# Patient Record
Sex: Male | Born: 1970 | Race: White | Hispanic: No | Marital: Single | Smoking: Never smoker
Health system: Southern US, Community
[De-identification: ages and names within clinical notes are randomized; demographics above are authoritative.]

## PROBLEM LIST (undated history)

## (undated) DIAGNOSIS — E119 Type 2 diabetes mellitus without complications: Secondary | ICD-10-CM

## (undated) DIAGNOSIS — K760 Fatty (change of) liver, not elsewhere classified: Secondary | ICD-10-CM

## (undated) DIAGNOSIS — E785 Hyperlipidemia, unspecified: Secondary | ICD-10-CM

## (undated) DIAGNOSIS — I10 Essential (primary) hypertension: Secondary | ICD-10-CM

## (undated) HISTORY — DX: Type 2 diabetes mellitus without complications: E11.9

## (undated) HISTORY — PX: APPENDECTOMY: SHX54

## (undated) HISTORY — DX: Fatty (change of) liver, not elsewhere classified: K76.0

## (undated) HISTORY — PX: TUMOR REMOVAL: SHX12

## (undated) HISTORY — DX: Essential (primary) hypertension: I10

## (undated) HISTORY — DX: Hyperlipidemia, unspecified: E78.5

---

## 1998-07-26 ENCOUNTER — Emergency Department (HOSPITAL_COMMUNITY): Admission: EM | Admit: 1998-07-26 | Discharge: 1998-07-26 | Payer: Self-pay | Admitting: Emergency Medicine

## 1998-09-24 ENCOUNTER — Emergency Department (HOSPITAL_COMMUNITY): Admission: EM | Admit: 1998-09-24 | Discharge: 1998-09-24 | Payer: Self-pay | Admitting: Emergency Medicine

## 1998-09-24 ENCOUNTER — Encounter: Payer: Self-pay | Admitting: Emergency Medicine

## 1998-10-12 ENCOUNTER — Encounter: Admission: RE | Admit: 1998-10-12 | Discharge: 1998-10-12 | Payer: Self-pay | Admitting: *Deleted

## 1998-12-17 ENCOUNTER — Emergency Department (HOSPITAL_COMMUNITY): Admission: EM | Admit: 1998-12-17 | Discharge: 1998-12-17 | Payer: Self-pay | Admitting: *Deleted

## 1999-02-22 ENCOUNTER — Encounter: Payer: Self-pay | Admitting: Emergency Medicine

## 1999-02-22 ENCOUNTER — Emergency Department (HOSPITAL_COMMUNITY): Admission: EM | Admit: 1999-02-22 | Discharge: 1999-02-22 | Payer: Self-pay | Admitting: Emergency Medicine

## 1999-05-20 ENCOUNTER — Emergency Department (HOSPITAL_COMMUNITY): Admission: EM | Admit: 1999-05-20 | Discharge: 1999-05-20 | Payer: Self-pay | Admitting: Emergency Medicine

## 1999-08-21 ENCOUNTER — Emergency Department (HOSPITAL_COMMUNITY): Admission: EM | Admit: 1999-08-21 | Discharge: 1999-08-21 | Payer: Self-pay | Admitting: Internal Medicine

## 1999-10-25 ENCOUNTER — Emergency Department (HOSPITAL_COMMUNITY): Admission: EM | Admit: 1999-10-25 | Discharge: 1999-10-25 | Payer: Self-pay | Admitting: Emergency Medicine

## 2000-05-13 ENCOUNTER — Emergency Department (HOSPITAL_COMMUNITY): Admission: EM | Admit: 2000-05-13 | Discharge: 2000-05-13 | Payer: Self-pay | Admitting: Emergency Medicine

## 2000-05-13 ENCOUNTER — Encounter: Payer: Self-pay | Admitting: Emergency Medicine

## 2000-05-23 ENCOUNTER — Emergency Department (HOSPITAL_COMMUNITY): Admission: EM | Admit: 2000-05-23 | Discharge: 2000-05-23 | Payer: Self-pay | Admitting: Emergency Medicine

## 2000-10-25 ENCOUNTER — Emergency Department (HOSPITAL_COMMUNITY): Admission: EM | Admit: 2000-10-25 | Discharge: 2000-10-25 | Payer: Self-pay | Admitting: Internal Medicine

## 2000-10-25 ENCOUNTER — Encounter: Payer: Self-pay | Admitting: Emergency Medicine

## 2001-06-30 ENCOUNTER — Emergency Department (HOSPITAL_COMMUNITY): Admission: EM | Admit: 2001-06-30 | Discharge: 2001-06-30 | Payer: Self-pay | Admitting: Emergency Medicine

## 2001-11-26 ENCOUNTER — Emergency Department (HOSPITAL_COMMUNITY): Admission: EM | Admit: 2001-11-26 | Discharge: 2001-11-26 | Payer: Self-pay | Admitting: Emergency Medicine

## 2001-12-03 ENCOUNTER — Emergency Department (HOSPITAL_COMMUNITY): Admission: EM | Admit: 2001-12-03 | Discharge: 2001-12-03 | Payer: Self-pay | Admitting: *Deleted

## 2002-01-13 ENCOUNTER — Emergency Department (HOSPITAL_COMMUNITY): Admission: EM | Admit: 2002-01-13 | Discharge: 2002-01-13 | Payer: Self-pay | Admitting: *Deleted

## 2002-07-08 ENCOUNTER — Emergency Department (HOSPITAL_COMMUNITY): Admission: EM | Admit: 2002-07-08 | Discharge: 2002-07-08 | Payer: Self-pay | Admitting: Emergency Medicine

## 2002-10-31 ENCOUNTER — Emergency Department (HOSPITAL_COMMUNITY): Admission: EM | Admit: 2002-10-31 | Discharge: 2002-11-01 | Payer: Self-pay | Admitting: Emergency Medicine

## 2002-12-04 ENCOUNTER — Emergency Department (HOSPITAL_COMMUNITY): Admission: EM | Admit: 2002-12-04 | Discharge: 2002-12-04 | Payer: Self-pay | Admitting: Emergency Medicine

## 2002-12-24 ENCOUNTER — Emergency Department (HOSPITAL_COMMUNITY): Admission: EM | Admit: 2002-12-24 | Discharge: 2002-12-25 | Payer: Self-pay | Admitting: Emergency Medicine

## 2003-10-29 ENCOUNTER — Emergency Department (HOSPITAL_COMMUNITY): Admission: EM | Admit: 2003-10-29 | Discharge: 2003-10-29 | Payer: Self-pay | Admitting: Emergency Medicine

## 2004-03-30 ENCOUNTER — Emergency Department (HOSPITAL_COMMUNITY): Admission: EM | Admit: 2004-03-30 | Discharge: 2004-03-30 | Payer: Self-pay | Admitting: Emergency Medicine

## 2004-06-10 ENCOUNTER — Emergency Department (HOSPITAL_COMMUNITY): Admission: EM | Admit: 2004-06-10 | Discharge: 2004-06-10 | Payer: Self-pay | Admitting: Emergency Medicine

## 2004-07-20 ENCOUNTER — Emergency Department (HOSPITAL_COMMUNITY): Admission: EM | Admit: 2004-07-20 | Discharge: 2004-07-20 | Payer: Self-pay | Admitting: Emergency Medicine

## 2004-07-24 ENCOUNTER — Emergency Department (HOSPITAL_COMMUNITY): Admission: EM | Admit: 2004-07-24 | Discharge: 2004-07-24 | Payer: Self-pay | Admitting: Emergency Medicine

## 2005-03-22 ENCOUNTER — Emergency Department (HOSPITAL_COMMUNITY): Admission: EM | Admit: 2005-03-22 | Discharge: 2005-03-22 | Payer: Self-pay | Admitting: Family Medicine

## 2005-03-24 ENCOUNTER — Emergency Department (HOSPITAL_COMMUNITY): Admission: EM | Admit: 2005-03-24 | Discharge: 2005-03-24 | Payer: Self-pay | Admitting: Family Medicine

## 2005-08-13 ENCOUNTER — Emergency Department (HOSPITAL_COMMUNITY): Admission: EM | Admit: 2005-08-13 | Discharge: 2005-08-13 | Payer: Self-pay | Admitting: Emergency Medicine

## 2005-11-17 ENCOUNTER — Emergency Department (HOSPITAL_COMMUNITY): Admission: EM | Admit: 2005-11-17 | Discharge: 2005-11-17 | Payer: Self-pay | Admitting: Emergency Medicine

## 2006-03-30 ENCOUNTER — Emergency Department (HOSPITAL_COMMUNITY): Admission: EM | Admit: 2006-03-30 | Discharge: 2006-03-31 | Payer: Self-pay | Admitting: Emergency Medicine

## 2006-04-11 ENCOUNTER — Emergency Department (HOSPITAL_COMMUNITY): Admission: EM | Admit: 2006-04-11 | Discharge: 2006-04-11 | Payer: Self-pay | Admitting: Emergency Medicine

## 2009-06-25 ENCOUNTER — Emergency Department (HOSPITAL_COMMUNITY): Admission: EM | Admit: 2009-06-25 | Discharge: 2009-06-25 | Payer: Self-pay | Admitting: Emergency Medicine

## 2009-10-15 ENCOUNTER — Emergency Department (HOSPITAL_COMMUNITY): Admission: EM | Admit: 2009-10-15 | Discharge: 2009-10-15 | Payer: Self-pay | Admitting: Emergency Medicine

## 2009-11-23 ENCOUNTER — Emergency Department (HOSPITAL_COMMUNITY): Admission: EM | Admit: 2009-11-23 | Discharge: 2009-11-24 | Payer: Self-pay | Admitting: Emergency Medicine

## 2010-07-05 ENCOUNTER — Emergency Department (HOSPITAL_COMMUNITY)
Admission: EM | Admit: 2010-07-05 | Discharge: 2010-07-06 | Disposition: A | Discharge status: Home / Self Care | Payer: Self-pay | Attending: Emergency Medicine | Admitting: Emergency Medicine

## 2010-07-05 DIAGNOSIS — Z09 Encounter for follow-up examination after completed treatment for conditions other than malignant neoplasm: Secondary | ICD-10-CM | POA: Insufficient documentation

## 2010-07-05 DIAGNOSIS — S81009A Unspecified open wound, unspecified knee, initial encounter: Secondary | ICD-10-CM | POA: Insufficient documentation

## 2010-07-05 DIAGNOSIS — X58XXXA Exposure to other specified factors, initial encounter: Secondary | ICD-10-CM | POA: Insufficient documentation

## 2010-08-02 LAB — POCT I-STAT, CHEM 8
BUN: 17 mg/dL (ref 6–23)
Calcium, Ion: 1.14 mmol/L (ref 1.12–1.32)
Chloride: 106 mEq/L (ref 96–112)
Glucose, Bld: 112 mg/dL — ABNORMAL HIGH (ref 70–99)
Hemoglobin: 15.6 g/dL (ref 13.0–17.0)
Potassium: 3.8 mEq/L (ref 3.5–5.1)
TCO2: 28 mmol/L (ref 0–100)

## 2010-08-02 LAB — D-DIMER, QUANTITATIVE: D-Dimer, Quant: 0.25 ug/mL-FEU (ref 0.00–0.48)

## 2010-08-06 LAB — URINALYSIS, ROUTINE W REFLEX MICROSCOPIC
Glucose, UA: NEGATIVE mg/dL
Nitrite: NEGATIVE
Protein, ur: NEGATIVE mg/dL
Urobilinogen, UA: 0.2 mg/dL (ref 0.0–1.0)

## 2010-08-06 LAB — GC/CHLAMYDIA PROBE AMP, GENITAL: GC Probe Amp, Genital: NEGATIVE

## 2010-08-10 ENCOUNTER — Emergency Department (HOSPITAL_COMMUNITY)
Admission: EM | Admit: 2010-08-10 | Discharge: 2010-08-10 | Disposition: A | Discharge status: Home / Self Care | Payer: Self-pay | Attending: Emergency Medicine | Admitting: Emergency Medicine

## 2010-08-10 DIAGNOSIS — M79609 Pain in unspecified limb: Secondary | ICD-10-CM | POA: Insufficient documentation

## 2010-08-10 DIAGNOSIS — R509 Fever, unspecified: Secondary | ICD-10-CM | POA: Insufficient documentation

## 2010-08-10 DIAGNOSIS — R21 Rash and other nonspecific skin eruption: Secondary | ICD-10-CM | POA: Insufficient documentation

## 2010-08-10 DIAGNOSIS — IMO0002 Reserved for concepts with insufficient information to code with codable children: Secondary | ICD-10-CM | POA: Insufficient documentation

## 2010-08-13 ENCOUNTER — Observation Stay (HOSPITAL_COMMUNITY)
Admission: EM | Admit: 2010-08-13 | Discharge: 2010-08-13 | Disposition: A | Discharge status: Home / Self Care | Payer: Self-pay | Attending: Emergency Medicine | Admitting: Emergency Medicine

## 2010-08-13 DIAGNOSIS — IMO0002 Reserved for concepts with insufficient information to code with codable children: Principal | ICD-10-CM | POA: Insufficient documentation

## 2010-08-13 LAB — CBC
Hemoglobin: 13.9 g/dL (ref 13.0–17.0)
MCHC: 33.7 g/dL (ref 30.0–36.0)
MCV: 85.3 fL (ref 78.0–100.0)
RDW: 13.7 % (ref 11.5–15.5)

## 2010-08-13 LAB — DIFFERENTIAL
Basophils Absolute: 0 10*3/uL (ref 0.0–0.1)
Basophils Relative: 0 % (ref 0–1)
Lymphocytes Relative: 21 % (ref 12–46)
Monocytes Absolute: 0.8 10*3/uL (ref 0.1–1.0)
Neutro Abs: 6.7 10*3/uL (ref 1.7–7.7)
Neutrophils Relative %: 68 % (ref 43–77)

## 2010-08-13 LAB — BASIC METABOLIC PANEL
CO2: 26 mEq/L (ref 19–32)
GFR calc Af Amer: >60 mL/min (ref >60)
GFR calc non Af Amer: 60 mL/min — ABNORMAL LOW (ref >60)
Potassium: 4 mEq/L (ref 3.5–5.1)
Sodium: 139 mEq/L (ref 135–145)

## 2010-08-15 ENCOUNTER — Emergency Department (HOSPITAL_COMMUNITY)
Admission: EM | Admit: 2010-08-15 | Discharge: 2010-08-15 | Disposition: A | Discharge status: Home / Self Care | Payer: Self-pay | Attending: Emergency Medicine | Admitting: Emergency Medicine

## 2010-08-15 DIAGNOSIS — IMO0002 Reserved for concepts with insufficient information to code with codable children: Secondary | ICD-10-CM | POA: Insufficient documentation

## 2010-08-15 DIAGNOSIS — Z09 Encounter for follow-up examination after completed treatment for conditions other than malignant neoplasm: Secondary | ICD-10-CM | POA: Insufficient documentation

## 2010-08-15 DIAGNOSIS — L2989 Other pruritus: Secondary | ICD-10-CM | POA: Insufficient documentation

## 2010-08-15 DIAGNOSIS — Z4800 Encounter for change or removal of nonsurgical wound dressing: Secondary | ICD-10-CM | POA: Insufficient documentation

## 2010-08-15 DIAGNOSIS — L298 Other pruritus: Secondary | ICD-10-CM | POA: Insufficient documentation

## 2010-08-16 LAB — CULTURE, ROUTINE-ABSCESS

## 2011-07-19 ENCOUNTER — Ambulatory Visit (INDEPENDENT_AMBULATORY_CARE_PROVIDER_SITE_OTHER): Payer: Self-pay | Admitting: Family Medicine

## 2011-07-19 ENCOUNTER — Encounter: Payer: Self-pay | Admitting: Family Medicine

## 2011-07-19 VITALS — BP 132/84 | HR 85 | Temp 97.9°F | Ht 67.0 in | Wt 223.0 lb

## 2011-07-19 DIAGNOSIS — M542 Cervicalgia: Secondary | ICD-10-CM

## 2011-07-19 NOTE — Patient Instructions (Signed)
You have cervical radiculopathy (a pinched nerve in the neck). Prednisone x 2 weeks as directed followed by meloxicam 15mg  daily for pain and to relieve irritation/inflammation of the nerve. Flexeril three times a day as needed for muscle spasms (can make you sleepy - if so do not drive while taking this). Vicodin for severe pain (no driving on this medicine). Call me when the [facility] (cone coverage) coverage goes through and we will move forward with an MRI of your neck if pain is still severe and start physical therapy. Consider cervical collar if severely painful. Simple range of motion exercises within limits of pain to prevent further stiffness. Consider physical therapy for stretching, exercises, traction, and modalities. Heat 15 minutes at a time 3-4 times a day to help with spasms. Watch head position when on computers, texting, when sleeping in bed - should in line with back to prevent further nerve traction and irritation.

## 2011-07-21 ENCOUNTER — Encounter: Payer: Self-pay | Admitting: Family Medicine

## 2011-07-21 DIAGNOSIS — M5412 Radiculopathy, cervical region: Secondary | ICD-10-CM | POA: Insufficient documentation

## 2011-07-21 NOTE — Progress Notes (Addendum)
Subjective:    Patient ID: Joe Pearson, male    DOB: Jun 11, 1970, 41 y.o.   MRN: [id number]  PCP: None  HPI 41 yo M here for neck, right arm pain.  Patient denies known injury. States about 1 week ago he woke up with fairly severe right sided neck pain radiating into shoulder and down arm. Associated with numbness and tingling. No prior neck problems. No bowel/bladder dysfunction. Taking vicodin as needed for pain. Tried prednisone dose pack which helped some but pain has worsened since stopping this.  History reviewed. No pertinent past medical history.  No current outpatient prescriptions on file prior to visit.    History reviewed. No pertinent past surgical history.  No Known Allergies  History   Social History  . Marital Status: Single    Spouse Name: N/A    Number of Children: N/A  . Years of Education: N/A   Occupational History  . Not on file.   Social History Main Topics  . Smoking status: Never Smoker   . Smokeless tobacco: Not on file  . Alcohol Use: Not on file  . Drug Use: Not on file  . Sexually Active: Not on file   Other Topics Concern  . Not on file   Social History Narrative  . No narrative on file    Family History  Problem Relation Age of Onset  . Sudden death Neg Hx   . Hyperlipidemia Neg Hx   . Hypertension Neg Hx   . Heart attack Neg Hx   . Diabetes Neg Hx     BP 132/84  Pulse 85  Temp(Src) 97.9 F (36.6 C) (Oral)  Ht 5\' 7"  (1.702 m)  Wt 223 lb (101.152 kg)  BMI 34.93 kg/m2  Review of Systems See HPI above.    Objective:   Physical Exam Gen: NAD  Neck: No gross deformity, swelling, bruising.  Spasms right paraspinal muscles. TTP right paraspinal muscles.  No midline/bony TTP. Minimal extension with pain.  Full flexion with mild pain, pain with turning to left side as well. BUE strength 5/5. Sensation intact to light touch currently. 2+ equal reflexes in triceps, biceps, brachioradialis tendons. Negative  spurlings. NV intact distal BUEs.  R shoulder: No swelling, ecchymoses.  No gross deformity. No TTP. FROM without painful arc. Negative Hawkins, Neers. Negative Speeds, Yergasons. Strength 5/5 with empty can and resisted internal/external rotation, no pain with these. Negative apprehension. NV intact distally.    Assessment & Plan:  1. Neck pain - 2/2 cervical radiculopathy.  Start longer course of prednisone, transition to meloxicam.  Flexeril and vicodin (#60, q6h prn) as needed.  Does not have insurance so provided with info to contact [facility] and apply for [employer] coverage.  If not improving will proceed with x-rays then MRI of cervical spine, consider PT.  Has a cervical collar already - use this as needed.  Call us when [employer] coverage goes through to proceed with imaging, start PT.  Addendum 3/12:  Patient called stating pain still about the same but [employer] Coverage has gone through - will move forward with MRI of cervical spine and call patient with results and how to proceed.  Addendum 3/20:  Reviewed MRI results and discussed with patient.  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  He reports his symptoms have significantly improved, denies bowel/bladder dysfunction, lower extremity symptoms, upper extremity weakness.  Advised we should continue to  monitor him - do not think ESIs would provide him with benefit should symptoms recur - would move forward with neurosurgical referral if medications didn't help with another flare.

## 2011-07-21 NOTE — Assessment & Plan Note (Signed)
2/2 cervical radiculopathy.  Start longer course of prednisone, transition to meloxicam.  Flexeril and vicodin (#60, q6h prn) as needed.  Does not have insurance so provided with info to contact [facility] and apply for [employer] coverage.  If not improving will proceed with x-rays then MRI of cervical spine, consider PT.  Has a cervical collar already - use this as needed.  Call us when [employer] coverage goes through to proceed with imaging, start PT.

## 2011-07-26 ENCOUNTER — Other Ambulatory Visit: Payer: Self-pay | Admitting: *Deleted

## 2011-07-26 MED ORDER — DIAZEPAM 5 MG PO TABS: ORAL_TABLET | ORAL | Status: DC

## 2011-07-26 NOTE — Progress Notes (Signed)
Addended by: Lenda Kelp on: 07/26/2011 08:41 AM   Modules accepted: Orders

## 2011-08-02 ENCOUNTER — Ambulatory Visit (HOSPITAL_BASED_OUTPATIENT_CLINIC_OR_DEPARTMENT_OTHER)
Admission: RE | Admit: 2011-08-02 | Discharge: 2011-08-02 | Disposition: A | Discharge status: Home / Self Care | Payer: Self-pay | Source: Ambulatory Visit | Attending: Family Medicine | Admitting: Family Medicine

## 2011-08-02 DIAGNOSIS — M542 Cervicalgia: Secondary | ICD-10-CM | POA: Insufficient documentation

## 2011-08-02 DIAGNOSIS — R209 Unspecified disturbances of skin sensation: Secondary | ICD-10-CM | POA: Insufficient documentation

## 2011-08-02 DIAGNOSIS — M5020 Other cervical disc displacement, unspecified cervical region: Secondary | ICD-10-CM | POA: Insufficient documentation

## 2011-08-02 DIAGNOSIS — M4802 Spinal stenosis, cervical region: Secondary | ICD-10-CM

## 2011-08-02 DIAGNOSIS — M79609 Pain in unspecified limb: Secondary | ICD-10-CM

## 2011-08-23 ENCOUNTER — Ambulatory Visit (INDEPENDENT_AMBULATORY_CARE_PROVIDER_SITE_OTHER): Payer: Self-pay | Admitting: Family Medicine

## 2011-08-23 ENCOUNTER — Other Ambulatory Visit: Payer: Self-pay | Admitting: Family Medicine

## 2011-08-23 ENCOUNTER — Encounter: Payer: Self-pay | Admitting: Family Medicine

## 2011-08-23 VITALS — BP 122/86 | HR 84 | Temp 98.0°F | Ht 68.0 in | Wt 212.0 lb

## 2011-08-23 DIAGNOSIS — M542 Cervicalgia: Secondary | ICD-10-CM

## 2011-08-23 NOTE — Patient Instructions (Signed)
You have cervical radiculopathy (a pinched nerve in the neck). I don't think it's a good idea to burst you with prednisone pills a third time. Vicodin for severe pain (no driving on this medicine). We will refer you to [facility] imaging to try cortisone shots in your neck to help with the pain. Try simple range of motion exercises within limits of pain to prevent further stiffness. Consider physical therapy for stretching, exercises, traction, and modalities. Heat 15 minutes at a time 3-4 times a day to help with spasms. Watch head position when on computers, texting, when sleeping in bed - should in line with back to prevent further nerve traction and irritation. We will also try to get you in with a neurosurgeon in case the shots don't help you.

## 2011-08-23 NOTE — Progress Notes (Signed)
  Subjective:    Patient ID: Joe Pearson, male    DOB: 1970/05/18, 41 y.o.   MRN: [id number]  PCP: None  HPI  41 yo M here for f/u neck, right arm pain.  3/5: Patient denies known injury. States about 1 week ago he woke up with fairly severe right sided neck pain radiating into shoulder and down arm. Associated with numbness and tingling. No prior neck problems. No bowel/bladder dysfunction. Taking vicodin as needed for pain. Tried prednisone dose pack which helped some but pain has worsened since stopping this.  4/9: Patient reports his pain, numbness, tingling completely resolved then has recurred at 10/10 pain 3 days ago. No new injury or increase in activity level. Out of medications now - he did report he never finished prednisone I prescribed him because he felt better. No bowel/bladder dysfunction. Radiates into right hand with numbness, tingling, and weakness.  History reviewed. No pertinent past medical history.  No current outpatient prescriptions on file prior to visit.    History reviewed. No pertinent past surgical history.  No Known Allergies  History   Social History  . Marital Status: Single    Spouse Name: N/A    Number of Children: N/A  . Years of Education: N/A   Occupational History  . Not on file.   Social History Main Topics  . Smoking status: Never Smoker   . Smokeless tobacco: Not on file  . Alcohol Use: Not on file  . Drug Use: Not on file  . Sexually Active: Not on file   Other Topics Concern  . Not on file   Social History Narrative  . No narrative on file    Family History  Problem Relation Age of Onset  . Sudden death Neg Hx   . Hyperlipidemia Neg Hx   . Hypertension Neg Hx   . Heart attack Neg Hx   . Diabetes Neg Hx     BP 122/86  Pulse 84  Temp(Src) 98 F (36.7 C) (Oral)  Ht 5\' 8"  (1.727 m)  Wt 212 lb (96.163 kg)  BMI 32.23 kg/m2  Review of Systems  See HPI above.    Objective:   Physical Exam  Gen:  NAD  Neck: No gross deformity, swelling, bruising.  Spasms right paraspinal muscles. TTP right paraspinal muscles.  No midline/bony TTP. Minimal extension with pain.  Full flexion with mild pain, Full left lat rotation, only 20 degrees right lat rotation with pain. 4/5 strength with right elbow flexion, wrist extension, finger abduction and thumb opposition.  5/5 strength left upper extremity and right elbow extension. Sensation intact to light touch currently. 2+ equal reflexes in triceps, biceps, brachioradialis tendons. Negative spurlings. NV intact distal BUEs.     Assessment & Plan:  1. Neck pain - 2/2 cervical radiculopathy.  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  Symptoms could be from either level as he has fairly diffuse right upper extremity symptoms.  Advised to continue with prior prednisone that he had not finished (only took 2 days worth).  Rx vicodin (#60, q6h prn) as needed.  Refer to [facility] imaging to trial ESIs.  Will also refer to neurosurgery - keep appointment if he does not improve with injections.  Given severity of MRI findings think it is more likely ESIs won't give him long-lasting relief from his symptoms.

## 2011-08-23 NOTE — Assessment & Plan Note (Signed)
2/2 cervical radiculopathy.  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  Symptoms could be from either level as he has fairly diffuse right upper extremity symptoms.  Advised to continue with prior prednisone that he had not finished (only took 2 days worth).  Rx vicodin (#60, q6h prn) as needed.  Refer to [facility] imaging to trial ESIs.  Will also refer to neurosurgery - keep appointment if he does not improve with injections.  Given severity of MRI findings think it is more likely ESIs won't give him long-lasting relief from his symptoms.

## 2011-08-24 ENCOUNTER — Ambulatory Visit
Admission: RE | Admit: 2011-08-24 | Discharge: 2011-08-24 | Disposition: A | Discharge status: Home / Self Care | Payer: No Typology Code available for payment source | Source: Ambulatory Visit | Attending: Family Medicine | Admitting: Family Medicine

## 2011-08-24 DIAGNOSIS — M542 Cervicalgia: Secondary | ICD-10-CM

## 2011-08-24 MED ORDER — IOHEXOL 300 MG/ML  SOLN
1.0000 mL | Freq: Once | INTRAMUSCULAR | Status: AC | PRN
  Administered 2011-08-24: 1 mL via EPIDURAL

## 2011-08-24 MED ORDER — TRIAMCINOLONE ACETONIDE 40 MG/ML IJ SUSP (RADIOLOGY)
60.0000 mg | Freq: Once | INTRAMUSCULAR | Status: AC
  Administered 2011-08-24: 60 mg via EPIDURAL

## 2011-08-24 NOTE — Discharge Instructions (Signed)

## 2012-11-06 ENCOUNTER — Encounter (HOSPITAL_COMMUNITY): Payer: Self-pay | Admitting: Emergency Medicine

## 2012-11-06 ENCOUNTER — Emergency Department (HOSPITAL_COMMUNITY)
Admission: EM | Admit: 2012-11-06 | Discharge: 2012-11-06 | Disposition: A | Discharge status: Home / Self Care | Payer: BC Managed Care – PPO | Attending: Emergency Medicine | Admitting: Emergency Medicine

## 2012-11-06 ENCOUNTER — Emergency Department (HOSPITAL_COMMUNITY): Payer: BC Managed Care – PPO

## 2012-11-06 DIAGNOSIS — R0789 Other chest pain: Secondary | ICD-10-CM | POA: Insufficient documentation

## 2012-11-06 DIAGNOSIS — R0602 Shortness of breath: Secondary | ICD-10-CM | POA: Insufficient documentation

## 2012-11-06 LAB — CBC WITH DIFFERENTIAL/PLATELET
Eosinophils Relative: 3 % (ref 0–5)
Hemoglobin: 13.6 g/dL (ref 13.0–17.0)
MCHC: 32.5 g/dL (ref 30.0–36.0)
Monocytes Absolute: 0.6 10*3/uL (ref 0.1–1.0)
Monocytes Relative: 9 % (ref 3–12)
Neutro Abs: 3.6 10*3/uL (ref 1.7–7.7)
Neutrophils Relative %: 52 % (ref 43–77)
RBC: 4.85 MIL/uL (ref 4.22–5.81)
RDW: 13.6 % (ref 11.5–15.5)
WBC: 7 10*3/uL (ref 4.0–10.5)

## 2012-11-06 LAB — POCT I-STAT TROPONIN I: Troponin i, poc: 0 ng/mL (ref 0.00–0.08)

## 2012-11-06 LAB — COMPREHENSIVE METABOLIC PANEL
Albumin: 3.8 g/dL (ref 3.5–5.2)
Alkaline Phosphatase: 55 U/L (ref 39–117)
Creatinine, Ser: 1.2 mg/dL (ref 0.50–1.35)
Potassium: 3.4 mEq/L — ABNORMAL LOW (ref 3.5–5.1)
Total Bilirubin: 0.3 mg/dL (ref 0.3–1.2)
Total Protein: 6.7 g/dL (ref 6.0–8.3)

## 2012-11-06 LAB — URINALYSIS, ROUTINE W REFLEX MICROSCOPIC
Bilirubin Urine: NEGATIVE
Glucose, UA: NEGATIVE mg/dL
Leukocytes, UA: NEGATIVE
Nitrite: NEGATIVE
Specific Gravity, Urine: 1.024 (ref 1.005–1.030)
pH: 6.5 (ref 5.0–8.0)

## 2012-11-06 LAB — RAPID URINE DRUG SCREEN, HOSP PERFORMED
Amphetamines: NOT DETECTED
Benzodiazepines: NOT DETECTED
Cocaine: NOT DETECTED

## 2012-11-06 MED ORDER — ASPIRIN 81 MG PO CHEW
324.0000 mg | CHEWABLE_TABLET | Freq: Once | ORAL | Status: AC
  Administered 2012-11-06: 324 mg via ORAL
  Filled 2012-11-06: qty 4

## 2012-11-06 NOTE — ED Provider Notes (Signed)
History    CSN: [id number] Arrival date & time 11/06/12  0022  First MD Initiated Contact with Patient 11/06/12 0050     Chief Complaint  Patient presents with  . Chest Pain   (Consider location/radiation/quality/duration/timing/severity/associated sxs/prior Treatment) HPI 42 year old male presents to emergency room with complaint of chest pain.  Pain started around 1145 this evening while driving.  Patient reports pain was sharp and stabbing in nature to the left side of his chest.  With that he became flushed, and had some pins and needle sensation.  Pain lasted 15-20 minutes and resolved.  He reports he had some very mild shortness of breath.  He denies any diaphoresis.  No prior history of same.  Patient denies any past medical history, is nonsmoker, no drug use.  No family history of coronary disease, hypertension, or diabetes.  Patient denies any new activities that would've triggered musculoskeletal pain.  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  Patient, reports he's been under a lot of stress recently, and felt more stressed than usual this evening   No past medical history on file. Past Surgical History  Procedure Laterality Date  . Appendectomy    . 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 Sudden death Neg Hx   . Hyperlipidemia Neg Hx   . Hypertension Neg Hx   . Heart attack Neg Hx   . Diabetes Neg Hx    History  Substance Use Topics  . Smoking status: Never Smoker   . Smokeless tobacco: Not on file  . Alcohol Use: Not on file    Review of Systems  All other systems reviewed and are negative.    Allergies  Review of patient's allergies indicates no known allergies.  Home Medications  No current outpatient prescriptions on file. BP 119/77  Pulse 71  Temp(Src) 98.2 F (36.8 C) (Oral)  Resp 25  Ht 5\' 7"  (1.702 m)  Wt 200 lb (90.719 kg)  BMI 31.32 kg/m2  SpO2 99% Physical Exam  Nursing note  and vitals reviewed. Constitutional: He is oriented to person, place, and time. He appears well-developed and well-nourished. No distress.  HENT:  Head: Normocephalic and atraumatic.  Nose: Nose normal.  Mouth/Throat: Oropharynx is clear and moist.  Eyes: Conjunctivae and EOM are normal. Pupils are equal, round, and reactive to light.  Neck: Normal range of motion. Neck supple. No JVD present. No tracheal deviation present. No thyromegaly present.  Cardiovascular: Normal rate, regular rhythm, normal heart sounds and intact distal pulses.  Exam reveals no gallop and no friction rub.   No murmur heard. Pulmonary/Chest: Effort normal and breath sounds normal. No stridor. No respiratory distress. He has no wheezes. He has no rales. He exhibits no tenderness.  Abdominal: Soft. Bowel sounds are normal. He exhibits no distension and no mass. There is no tenderness. There is no rebound and no guarding.  Musculoskeletal: Normal range of motion. He exhibits no edema and no tenderness.  Lymphadenopathy:    He has no cervical adenopathy.  Neurological: He is alert and oriented to person, place, and time. He has normal reflexes. He exhibits normal muscle tone. Coordination normal.  Skin: Skin is warm and dry. No rash noted. No erythema. No pallor.  Psychiatric: He has a normal mood and affect. His behavior is normal. Judgment and thought content normal.    ED Course  Procedures (including critical care time) Labs  Reviewed  COMPREHENSIVE METABOLIC PANEL - Abnormal; Notable for the following:    Potassium 3.4 (*)    Glucose, Bld 137 (*)    GFR calc non Af Amer 72 (*)    GFR calc Af Amer 84 (*)    All other components within normal limits  CBC WITH DIFFERENTIAL  URINALYSIS, ROUTINE W REFLEX MICROSCOPIC  URINE RAPID DRUG SCREEN (HOSP PERFORMED)  POCT I-STAT TROPONIN I  POCT I-STAT TROPONIN I    Date: 11/06/2012  Rate: 84  Rhythm: normal sinus rhythm  QRS Axis: normal  Intervals: normal  ST/T  Wave abnormalities: normal  Conduction Disutrbances:none  Narrative Interpretation:   Old EKG Reviewed: unchanged    Dg Chest 2 View  11/06/2012   *RADIOLOGY REPORT*  Clinical Data: Chest pain.  CHEST - 2 VIEW  Comparison: 10/10/2012  Findings: Heart and mediastinal contours are within normal limits. No focal opacities or effusions.  No acute bony abnormality.  IMPRESSION: Negative exam.   Original Report Authenticated By: Charlett Nose, M.D.   1. Atypical chest pain     MDM  42 yo male with no risk factors for CAD with 20 minutes of sharp pinching/stabbing chest pain.  Workup here unremarkable.  Will start patient on daily baby aspirin and patient encouraged to establish pcm.  Olivia Mackie, MD 11/06/12 [phone]

## 2012-11-06 NOTE — ED Notes (Signed)
Pt c/o of L sided CP onset 45 mins ago. SOB, tingling, dry mouth.

## 2012-11-22 ENCOUNTER — Emergency Department (HOSPITAL_COMMUNITY)
Admission: EM | Admit: 2012-11-22 | Discharge: 2012-11-22 | Disposition: A | Discharge status: Home / Self Care | Payer: BC Managed Care – PPO | Attending: Emergency Medicine | Admitting: Emergency Medicine

## 2012-11-22 ENCOUNTER — Encounter (HOSPITAL_COMMUNITY): Payer: Self-pay

## 2012-11-22 DIAGNOSIS — K649 Unspecified hemorrhoids: Secondary | ICD-10-CM | POA: Diagnosis present

## 2012-11-22 DIAGNOSIS — N4889 Other specified disorders of penis: Secondary | ICD-10-CM | POA: Diagnosis present

## 2012-11-22 DIAGNOSIS — K5900 Constipation, unspecified: Secondary | ICD-10-CM | POA: Insufficient documentation

## 2012-11-22 DIAGNOSIS — K921 Melena: Secondary | ICD-10-CM | POA: Insufficient documentation

## 2012-11-22 DIAGNOSIS — N489 Disorder of penis, unspecified: Secondary | ICD-10-CM | POA: Insufficient documentation

## 2012-11-22 DIAGNOSIS — K644 Residual hemorrhoidal skin tags: Secondary | ICD-10-CM | POA: Insufficient documentation

## 2012-11-22 NOTE — ED Notes (Signed)
Pt discharged prior to nurse assessment. Pt is a x 4. Ambulatory. Denies any pain.

## 2012-11-22 NOTE — ED Notes (Addendum)
Pt presents with 3 day h/o hemorrhoid pain.  Pt reports bleeding from area, reports pain with bowel movements. Pt reports h/o same. Pt reports pain radiates around to tip of penis, reports pain with voiding as well.

## 2012-11-22 NOTE — ED Provider Notes (Signed)
History    CSN: [id number] Arrival date & time 11/22/12  1322  None    Chief Complaint  Patient presents with  . Rectal Pain   (Consider location/radiation/quality/duration/timing/severity/associated sxs/prior Treatment) Patient is a 42 y.o. male presenting with hematochezia. The history is provided by the patient.  Rectal Bleeding Quality:  Bright red Amount:  Scant (on paper and intermittently in stool) Duration:  2 days (worsening over past two days, but hx of hemorrhoids for 2 years) Timing:  Intermittent Progression:  Worsening Chronicity:  Recurrent Context: constipation, hemorrhoids (x 2 years and worsening, has tried Prep H with little relief, no change in diet or toilet habits) and rectal pain   Context: not anal fissures, not diarrhea and not rectal injury   Pain details:    Quality:  Sharp   Severity:  Moderate   Duration:  2 days (acutely worsened in past two days)   Timing:  Worse with defecation   Progression:  Worsening Similar prior episodes: yes   Relieved by:  None tried Worsened by:  Defecation and wiping Associated symptoms: no abdominal pain, no dizziness, no epistaxis, no fever, no hematemesis, no light-headedness, no loss of consciousness, no recent illness and no vomiting   Associated symptoms comment:  + penis pain over past 2 days as well, no dc, no hx of STI, no exposure, no dysuria Risk factors: no anticoagulant use and no hx of colorectal cancer    History reviewed. No pertinent past medical history. Past Surgical History  Procedure Laterality Date  . Appendectomy    . 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 Sudden death Neg Hx   . Hyperlipidemia Neg Hx   . Hypertension Neg Hx   . Heart attack Neg Hx   . Diabetes Neg Hx    History  Substance Use Topics  . Smoking status: Never Smoker   . Smokeless tobacco: Not on file  . Alcohol Use: No    Review of Systems   Constitutional: Negative for fever, chills, diaphoresis, activity change, appetite change and fatigue.  HENT: Negative for nosebleeds, congestion, sore throat, rhinorrhea, sneezing and trouble swallowing.   Eyes: Negative for pain and redness.  Respiratory: Negative for cough, choking, chest tightness, shortness of breath, wheezing and stridor.   Cardiovascular: Negative for chest pain and leg swelling.  Gastrointestinal: Positive for blood in stool, hematochezia and anal bleeding. Negative for nausea, vomiting, abdominal pain, diarrhea, constipation, abdominal distention and hematemesis.  Genitourinary: Positive for penile pain.  Musculoskeletal: Negative for myalgias and back pain.  Skin: Negative for rash.  Neurological: Negative for dizziness, loss of consciousness, speech difficulty, weakness, light-headedness, numbness and headaches.  Hematological: Negative for adenopathy.  Psychiatric/Behavioral: Negative for confusion.    Allergies  Review of patient's allergies indicates no known allergies.  Home Medications  No current outpatient prescriptions on file. BP 130/86  Pulse 67  Temp(Src) 97.2 F (36.2 C) (Oral)  Resp 15  SpO2 98% Physical Exam  Nursing note and vitals reviewed. Constitutional: He is oriented to person, place, and time. He appears well-developed and well-nourished. No distress.  HENT:  Head: Normocephalic and atraumatic.  Eyes: Conjunctivae and EOM are normal. Right eye exhibits no discharge. Left eye exhibits no discharge.  Neck: Normal range of motion. Neck supple. No tracheal deviation present.  Cardiovascular: Normal rate, regular rhythm and normal heart sounds.  Exam reveals no friction rub.  No murmur heard. Pulmonary/Chest: Effort normal and breath sounds normal. No stridor. No respiratory distress. He has no wheezes. He has no rales. He exhibits no tenderness.  Abdominal: Soft. He exhibits no distension. There is no tenderness. There is no rebound  and no guarding.  Genitourinary: Testes normal and penis normal. Rectal exam shows external hemorrhoid (not thrombosed). Rectal exam shows no fissure and no mass.  Neurological: He is alert and oriented to person, place, and time.  Skin: Skin is warm.  Psychiatric: He has a normal mood and affect.    ED Course  Procedures (including critical care time) Labs Reviewed  GC/CHLAMYDIA PROBE AMP   No results found. 1. Hemorrhoids   2. Penis pain     MDM  [name] 42 y.o. presents to hemorrhoids. Patient also complained of small amount blood on the paper in his stool. He has a history hemorrhage. External hemorrhoids are not thrombosed exam. No fissures. No gross blood on exam. Patient also complained of penile pain. No STI exposures. No discharge. GC Chlamydia collected but we'll not treat preemptively. Patient provided educational management of his hemorrhoids. He was instructed to follow up with PCP for further evaluation and management. I discussed this patient's care with my attending, Dr. Ranae Palms.  Sena Hitch, MD 11/23/12 [phone]

## 2012-11-23 LAB — GC/CHLAMYDIA PROBE AMP: GC Probe RNA: NEGATIVE

## 2012-11-24 NOTE — ED Provider Notes (Signed)
I saw and evaluated the patient, reviewed the resident's note and I agree with the findings and plan. Painful hemorrhoids. No evidence of thrombosis.   Loren Racer, MD 11/24/12 [phone]

## 2012-12-21 ENCOUNTER — Encounter (INDEPENDENT_AMBULATORY_CARE_PROVIDER_SITE_OTHER): Payer: Self-pay | Admitting: General Surgery

## 2012-12-21 ENCOUNTER — Ambulatory Visit (INDEPENDENT_AMBULATORY_CARE_PROVIDER_SITE_OTHER): Payer: BC Managed Care – PPO | Admitting: General Surgery

## 2012-12-21 VITALS — BP 110/76 | HR 68 | Temp 97.0°F | Resp 15 | Ht 67.0 in | Wt 194.8 lb

## 2012-12-21 DIAGNOSIS — K602 Anal fissure, unspecified: Secondary | ICD-10-CM

## 2012-12-21 MED ORDER — AMBULATORY NON FORMULARY MEDICATION: 1.0000 [drp] | Freq: Two times a day (BID) | Status: DC

## 2012-12-21 NOTE — Progress Notes (Signed)
Patient ID: Joe Pearson, male   DOB: 26-May-1970, 42 y.o.   MRN: [id number]  Chief Complaint  Patient presents with  . New Evaluation    eval anal fissure/pain    HPI Joe Pearson is a 42 y.o. male.   HPI 42 yo WM referred by Dr Elnoria Howard for evaluation of an anal fissure and anal pain. Patient states that he started having problems about 3-4 weeks ago. He states that he has severe pain in his rectum. He states that it hurts very bad and he also has spasms in the area. He states he does have a history of intermittent constipation. He states most of time he will have a bowel movement at least every other day but is recently trying to have a bowel movement daily. He states there has been some bright red blood in the toilet however it is only a few drops. He denies any fever, chills, weight loss, nausea, vomiting. He drinks a couple glasses of water a day. He does not really eat any foods that are high in fiber. He does not take any fiber supplements. He denies any family history colorectal cancer. He states that his stools are very hard. He has never had a colonoscopy. He states that he initially used the ointment that was given to him which helped; however, he was instructed to fully insert it up in his rectum and he states that he just could not tolerate that. He is also doing sitz baths Intermittently History reviewed. No pertinent past medical history.  Past Surgical History  Procedure Laterality Date  . Appendectomy    . 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 Sudden death Neg Hx   . Hyperlipidemia Neg Hx   . Hypertension Neg Hx   . Heart attack Neg Hx   . Diabetes Neg Hx   . Cancer Mother     breast    Social History History  Substance Use Topics  . Smoking status: Never Smoker   . Smokeless tobacco: Never Used  . Alcohol Use: No    No Known Allergies  Current Outpatient Prescriptions  Medication Sig Dispense  Refill  . AMBULATORY NON FORMULARY MEDICATION Apply 1-2 drops topically 2 (two) times daily. Medication Name: Nifedipine ointment 2% Apply small amount around anus and slightly on inside twice a day for 6 weeks  30 g  2   No current facility-administered medications for this visit.    Review of Systems Review of Systems  Constitutional: Negative for fever, chills, appetite change and unexpected weight change.  HENT: Negative for congestion and trouble swallowing.   Eyes: Negative for visual disturbance.  Respiratory: Negative for chest tightness and shortness of breath.   Cardiovascular: Negative for chest pain and leg swelling.       No PND, no orthopnea, no DOE  Gastrointestinal: Positive for constipation, anal bleeding and rectal pain. Negative for abdominal pain and abdominal distention.       See HPI  Genitourinary: Negative for dysuria and hematuria.  Musculoskeletal: Negative.   Skin: Negative for rash.  Neurological: Negative for seizures and speech difficulty.  Hematological: Does not bruise/bleed easily.  Psychiatric/Behavioral: Negative for behavioral problems and confusion.    Blood pressure 110/76, pulse 68, temperature 97 F (36.1 C), temperature source Temporal, resp. rate 15, height 5\' 7"  (1.702 m), weight 194 lb 12.8 oz (88.361 kg).  Physical Exam Physical  Exam  Constitutional: He is oriented to person, place, and time. He appears well-developed and well-nourished. No distress.  HENT:  Head: Normocephalic and atraumatic.  Right Ear: External ear normal.  Left Ear: External ear normal.  Eyes: Conjunctivae are normal. No scleral icterus.  Neck: Normal range of motion. Neck supple. No tracheal deviation present. No thyromegaly present.  Cardiovascular: Normal rate, normal heart sounds and intact distal pulses.   Pulmonary/Chest: Effort normal and breath sounds normal. No respiratory distress. He has no wheezes.  Abdominal: Soft. He exhibits no distension. There is  no tenderness. There is no rebound and no guarding.    Genitourinary: Rectal exam shows fissure.     Significant posterior midline tear with a few scattered skin tags. DRE/anoscopy deferred secondary to fissure  Musculoskeletal: Normal range of motion. He exhibits no edema and no tenderness.  Lymphadenopathy:    He has no cervical adenopathy.  Neurological: He is alert and oriented to person, place, and time. He exhibits normal muscle tone.  Skin: Skin is warm and dry. No rash noted. He is not diaphoretic. No erythema. No pallor.  Psychiatric: He has a normal mood and affect. His behavior is normal. Judgment and thought content normal.    Data Reviewed Dr Haywood Pao notes  Assessment    Anal fissure     Plan    We discussed the etiology of anal fissures. The patient was given educational material as well as diagrams. We discussed nonoperative and operative management of anal fissures.  We discussed nonoperative management including correcting underlying bowel habits such as constipation, avoiding bathroom reading, avoiding straining with defecation. We also discussed the use of topical ointments such as nifedipine ointment. We also discussed the use of Botox injection.  With respect to surgical intervention, we discussed an anal sphincterotomy. I described how the procedure is performed. We also discussed the aftercare. We discussed the risk and benefits of surgery including but not limited to bleeding, infection, blood clot formation, general anesthesia risk, urinary retention, and the risk of incontinence. We discussed a 20-25% chance of incontinence to flatus, a 10-20% chance of incontinence to liquid stool, and a 5-10% chance of incontinence to solid stool. I explained that the percentages I quoted are from the literature and not from my personal practice experience.  My recommendation was to start with non-operative management first.  The patient has elected To start with  nonoperative management. We discussed how crucial it is for him to do everything on a daily frequent basis in order for him to see any improvement. I told them after he starts using the nifedipine ointment he needs to use it twice a day at least for 6 weeks even if he starts getting improvement. He was given a prescription for nifedipine 2% ointment to apply twice a day. We also discussed the importance of drinking 6-8 glasses of water a day. He was given instructions for supplemental fiber such as Metamucil or Benefiber which she needs to do on a daily basis. Also told to continue to do sitz baths. He is also instructed to avoid using any ointments that have hydrocortisone ointment in them  Followup 6 weeks  Mary Sella. Andrey Campanile, MD, FACS General, Bariatric, & Minimally Invasive Surgery [facility], [place]          [facility] M 12/21/2012, 1:56 PM

## 2012-12-21 NOTE — Patient Instructions (Signed)
Anal Fissure, Adult An anal fissure is a small tear or crack in the skin around the anus. Bleeding from a fissure usually stops on its own within a few minutes. However, bleeding will often reoccur with each bowel movement until the crack heals.  CAUSES   Passing large, hard stools.  Frequent diarrheal stools.  Constipation.  Inflammatory bowel disease (Crohn's disease or ulcerative colitis).  Infections.  Anal sex. SYMPTOMS   Small amounts of blood seen on your stools, on toilet paper, or in the toilet after a bowel movement.  Rectal bleeding.  Painful bowel movements.  Itching or irritation around the anus. DIAGNOSIS Your caregiver will examine the anal area. An anal fissure can usually be seen with careful inspection. A rectal exam may be performed and a short tube (anoscope) may be used to examine the anal canal. TREATMENT   You may be instructed to take fiber supplements. These supplements can soften your stool to help make bowel movements easier.  Sitz baths may be recommended to help heal the tear. Do not use soap in the sitz baths.  A medicated cream or ointment may be prescribed to lessen discomfort. HOME CARE INSTRUCTIONS   Maintain a diet high in fruits, whole grains, and vegetables. Avoid constipating foods like bananas and dairy products.  Take sitz baths as directed by your caregiver.  Drink enough fluids to keep your urine clear or pale yellow.  Only take over-the-counter or prescription medicines for pain, discomfort, or fever as directed by your caregiver. Do not take aspirin as this may increase bleeding.  Do not use ointments containing numbing medications (anesthetics) or hydrocortisone. They could slow healing. SEEK MEDICAL CARE IF:   Your fissure is not completely healed within 3 days.  You have further bleeding.  You have a fever.  You have diarrhea mixed with blood.  You have pain.  Your problem is getting worse rather than  better. MAKE SURE YOU:   Understand these instructions.  Will watch your condition.  Will get help right away if you are not doing well or get worse. Document Released: 05/02/2005 Document Revised: 07/25/2011 Document Reviewed: 10/17/2010 [facility] Patient Information 2014 [place], [place].  GETTING TO GOOD BOWEL HEALTH. Irregular bowel habits such as constipation and diarrhea can lead to many problems over time.  Having one soft bowel movement a day is the most important way to prevent further problems.  The anorectal canal is designed to handle stretching and feces to safely manage our ability to get rid of solid waste (feces, poop, stool) out of our body.  BUT, hard constipated stools can act like ripping concrete bricks and diarrhea can be a burning fire to this very sensitive area of our body, causing inflamed hemorrhoids, anal fissures, increasing risk is perirectal abscesses, abdominal pain/bloating, an making irritable bowel worse.     The goal: ONE SOFT BOWEL MOVEMENT A DAY!  To have soft, regular bowel movements:    Drink at least 8 tall glasses of water a day.     Take plenty of fiber.  Fiber is the undigested part of plant food that passes into the colon, acting s "natures broom" to encourage bowel motility and movement.  Fiber can absorb and hold large amounts of water. This results in a larger, bulkier stool, which is soft and easier to pass. Work gradually over several weeks up to 6 servings a day of fiber (25g a day even more if needed) in the form of: o Vegetables -- Root (potatoes,  carrots, turnips), leafy green (lettuce, salad greens, celery, spinach), or cooked high residue (cabbage, broccoli, etc) o Fruit -- Fresh (unpeeled skin & pulp), Dried (prunes, apricots, cherries, etc ),  or stewed ( applesauce)  o Whole grain breads, pasta, etc (whole wheat)  o Bran cereals    Bulking Agents -- This type of water-retaining fiber generally is easily obtained each day by one of the  following:  o Psyllium bran -- The psyllium plant is remarkable because its ground seeds can retain so much water. This product is available as Metamucil, Konsyl, Effersyllium, Per Diem Fiber, or the less expensive generic preparation in drug and health food stores. Although labeled a laxative, it really is not a laxative.  o Methylcellulose -- This is another fiber derived from wood which also retains water. It is available as Citrucel. o Polyethylene Glycol - and "artificial" fiber commonly called Miralax or Glycolax.  It is helpful for people with gassy or bloated feelings with regular fiber o Flax Seed - a less gassy fiber than psyllium   No reading or other relaxing activity while on the toilet. If bowel movements take longer than 5 minutes, you are too constipated   AVOID CONSTIPATION.  High fiber and water intake usually takes care of this.  Sometimes a laxative is needed to stimulate more frequent bowel movements, but    Laxatives are not a good long-term solution as it can wear the colon out. o Osmotics (Milk of Magnesia, Fleets phosphosoda, Magnesium citrate, MiraLax, GoLytely) are safer than  o Stimulants (Senokot, Castor Oil, Dulcolax, Ex Lax)    o Do not take laxatives for more than 7days in a row.    IF SEVERELY CONSTIPATED, try a Bowel Retraining Program: o Do not use laxatives.  o Eat a diet high in roughage, such as bran cereals and leafy vegetables.  o Drink six (6) ounces of prune or apricot juice each morning.  o Eat two (2) large servings of stewed fruit each day.  o Take one (1) heaping tablespoon of a psyllium-based bulking agent twice a day. Use sugar-free sweetener when possible to avoid excessive calories.  o Eat a normal breakfast.  o Set aside 15 minutes after breakfast to sit on the toilet, but do not strain to have a bowel movement.  o If you do not have a bowel movement by the third day, use an enema and repeat the above steps.  o

## 2013-01-24 ENCOUNTER — Encounter (INDEPENDENT_AMBULATORY_CARE_PROVIDER_SITE_OTHER): Payer: BC Managed Care – PPO | Admitting: General Surgery

## 2013-02-14 ENCOUNTER — Encounter (INDEPENDENT_AMBULATORY_CARE_PROVIDER_SITE_OTHER): Payer: BC Managed Care – PPO | Admitting: General Surgery

## 2013-02-19 ENCOUNTER — Encounter (INDEPENDENT_AMBULATORY_CARE_PROVIDER_SITE_OTHER): Payer: Self-pay | Admitting: General Surgery

## 2013-03-15 ENCOUNTER — Telehealth (INDEPENDENT_AMBULATORY_CARE_PROVIDER_SITE_OTHER): Payer: Self-pay

## 2013-03-15 NOTE — Telephone Encounter (Signed)
Patient calling into office to report having rectal pain.  Patient thinks it's from hemorrhoids but in uncertain what is the true cause of his pain.  Patient reports that he is following Dr. Tawana Scale instructions as follows: drinking 6-8 glasses of water a day, supplemental fiber such as Metamucil or Benefiber daily and states he's doing sitz baths. Patient scheduled to see Dr. Andrey Campanile in December but, patient is requesting earlier appointment for further evaluation.  Patient advised the message will be forwarded to Dr. Andrey Campanile for review and we will contact patient to discuss in further detail.

## 2013-03-15 NOTE — Telephone Encounter (Signed)
Jade - pls put him on for that Tuesday morning of my LDOW where we created that short stealth office

## 2013-03-16 ENCOUNTER — Encounter (HOSPITAL_COMMUNITY): Payer: Self-pay | Admitting: Emergency Medicine

## 2013-03-16 ENCOUNTER — Emergency Department (HOSPITAL_COMMUNITY)
Admission: EM | Admit: 2013-03-16 | Discharge: 2013-03-16 | Disposition: A | Discharge status: Home / Self Care | Payer: BC Managed Care – PPO | Attending: Emergency Medicine | Admitting: Emergency Medicine

## 2013-03-16 DIAGNOSIS — K602 Anal fissure, unspecified: Secondary | ICD-10-CM

## 2013-03-16 MED ORDER — DOCUSATE SODIUM 100 MG PO CAPS: 100.0000 mg | ORAL_CAPSULE | Freq: Two times a day (BID) | ORAL | Status: DC

## 2013-03-16 MED ORDER — NAPROXEN 375 MG PO TABS: 375.0000 mg | ORAL_TABLET | Freq: Two times a day (BID) | ORAL | Status: DC | PRN

## 2013-03-16 NOTE — ED Notes (Signed)
Pt from home reports that he has had hemorrhoid pain x2 days. Pt denies that they are bleeding but are swollen. Pt is unable to sit down. Pt has used OTC meds w/o relief. Pt is A&O and in NAD

## 2013-03-18 NOTE — Telephone Encounter (Signed)
Patient called back and agreeable with appt, appt made for 03/26/13 at 915a.

## 2013-03-18 NOTE — Telephone Encounter (Signed)
LMOM for patient to call back. To make him aware Dr Andrey Campanile would like to see him on 03/26/2013 at 9:15 am. Appt needs made when patient calls back.

## 2013-03-21 NOTE — ED Provider Notes (Signed)
CSN: [id number]     Arrival date & time 03/16/13  1352 History   First MD Initiated Contact with Patient 03/16/13 1502     Chief Complaint  Patient presents with  . Hemorrhoids   (Consider location/radiation/quality/duration/timing/severity/associated sxs/prior Treatment) HPI  42 year old male with rectal pain. Onset about 2 days ago. Pain at rest which is severely exacerbated with bowel movements and to a lesser degree with sitting. No bleeding. No abdominal or back pain. Past history of similar type pain. he was called in a prescription of nifedipine ointment which he has been using with minimal relief. Denies or vomiting. No fevers or chills. No history of rectal or colon surgery. No diarrhea.  History reviewed. No pertinent past medical history. Past Surgical History  Procedure Laterality Date  . Appendectomy    . 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 Sudden death Neg Hx   . Hyperlipidemia Neg Hx   . Hypertension Neg Hx   . Heart attack Neg Hx   . Diabetes Neg Hx   . Cancer Mother     breast   History  Substance Use Topics  . Smoking status: Never Smoker   . Smokeless tobacco: Never Used  . Alcohol Use: No    Review of Systems  All systems reviewed and negative, other than as noted in HPI.   Allergies  Review of patient's allergies indicates no known allergies.  Home Medications   Current Outpatient Rx  Name  Route  Sig  Dispense  Refill  . docusate sodium (COLACE) 100 MG capsule   Oral   Take 1 capsule (100 mg total) by mouth 2 (two) times daily.   60 capsule   0   . naproxen (NAPROSYN) 375 MG tablet   Oral   Take 1 tablet (375 mg total) by mouth 2 (two) times daily as needed.   20 tablet   0    BP 125/84  Pulse 86  Temp(Src) 98.1 F (36.7 C) (Oral)  Resp 20  SpO2 96% Physical Exam  Nursing note and vitals reviewed. Constitutional: He appears well-developed and well-nourished. No  distress.  HENT:  Head: Normocephalic and atraumatic.  Eyes: Conjunctivae are normal. Right eye exhibits no discharge. Left eye exhibits no discharge.  Neck: Neck supple.  Cardiovascular: Normal rate, regular rhythm and normal heart sounds.  Exam reveals no gallop and no friction rub.   No murmur heard. Pulmonary/Chest: Effort normal and breath sounds normal. No respiratory distress.  Abdominal: Soft. He exhibits no distension. There is no tenderness.  Genitourinary:  skin tags. A small tear at the posterior midline consistent with an anal fissure. No bleeding noted. DRE deferred.  Musculoskeletal: He exhibits no edema and no tenderness.  Neurological: He is alert.  Skin: Skin is warm and dry.  Psychiatric: He has a normal mood and affect. His behavior is normal. Thought content normal.    ED Course  Procedures (including critical care time) Labs Review Labs Reviewed - No data to display Imaging Review No results found.  EKG Interpretation   None       MDM   1. Anal fissure    42 year old male with rectal pain. Exam consistent with a fissure. She was just represcribed nifedipine ointment. The patient was started on a stool softener. Discussed the need for increase dietary bulk and staying well hydrated. NSAIDs. Would rather avoid narcotics as they may potentially exacerbate  Raeford Razor, MD 03/21/13 [phone]

## 2013-03-26 ENCOUNTER — Encounter (INDEPENDENT_AMBULATORY_CARE_PROVIDER_SITE_OTHER): Payer: Self-pay | Admitting: General Surgery

## 2013-03-26 ENCOUNTER — Ambulatory Visit (INDEPENDENT_AMBULATORY_CARE_PROVIDER_SITE_OTHER): Payer: BC Managed Care – PPO | Admitting: General Surgery

## 2013-03-26 VITALS — BP 120/70 | HR 88 | Resp 14 | Ht 66.0 in | Wt 187.0 lb

## 2013-03-26 DIAGNOSIS — K602 Anal fissure, unspecified: Secondary | ICD-10-CM

## 2013-03-26 NOTE — Patient Instructions (Signed)
GETTING TO GOOD BOWEL HEALTH. Irregular bowel habits such as constipation and diarrhea can lead to many problems over time.  Having one soft bowel movement a day is the most important way to prevent further problems.  The anorectal canal is designed to handle stretching and feces to safely manage our ability to get rid of solid waste (feces, poop, stool) out of our body.  BUT, hard constipated stools can act like ripping concrete bricks and diarrhea can be a burning fire to this very sensitive area of our body, causing inflamed hemorrhoids, anal fissures, increasing risk is perirectal abscesses, abdominal pain/bloating, an making irritable bowel worse.     The goal: ONE SOFT BOWEL MOVEMENT A DAY!  To have soft, regular bowel movements:    Drink at least 8 tall glasses of water a day.     Take plenty of fiber.  Fiber is the undigested part of plant food that passes into the colon, acting s "natures broom" to encourage bowel motility and movement.  Fiber can absorb and hold large amounts of water. This results in a larger, bulkier stool, which is soft and easier to pass. Work gradually over several weeks up to 6 servings a day of fiber (25g a day even more if needed) in the form of: o Vegetables -- Root (potatoes, carrots, turnips), leafy green (lettuce, salad greens, celery, spinach), or cooked high residue (cabbage, broccoli, etc) o Fruit -- Fresh (unpeeled skin & pulp), Dried (prunes, apricots, cherries, etc ),  or stewed ( applesauce)  o Whole grain breads, pasta, etc (whole wheat)  o Bran cereals    Bulking Agents -- This type of water-retaining fiber generally is easily obtained each day by one of the following:  o Psyllium bran -- The psyllium plant is remarkable because its ground seeds can retain so much water. This product is available as Metamucil, Konsyl, Effersyllium, Per Diem Fiber, or the less expensive generic preparation in drug and health food stores. Although labeled a laxative, it really  is not a laxative.  o Methylcellulose -- This is another fiber derived from wood which also retains water. It is available as Citrucel. o Polyethylene Glycol - and "artificial" fiber commonly called Miralax or Glycolax.  It is helpful for people with gassy or bloated feelings with regular fiber o Flax Seed - a less gassy fiber than psyllium   No reading or other relaxing activity while on the toilet. If bowel movements take longer than 5 minutes, you are too constipated   AVOID CONSTIPATION.  High fiber and water intake usually takes care of this.  Sometimes a laxative is needed to stimulate more frequent bowel movements, but    Laxatives are not a good long-term solution as it can wear the colon out. o Osmotics (Milk of Magnesia, Fleets phosphosoda, Magnesium citrate, MiraLax, GoLytely) are safer than  o Stimulants (Senokot, Castor Oil, Dulcolax, Ex Lax)    o Do not take laxatives for more than 7days in a row.    IF SEVERELY CONSTIPATED, try a Bowel Retraining Program: o Do not use laxatives.  o Eat a diet high in roughage, such as bran cereals and leafy vegetables.  o Drink six (6) ounces of prune or apricot juice each morning.  o Eat two (2) large servings of stewed fruit each day.  o Take one (1) heaping tablespoon of a psyllium-based bulking agent twice a day. Use sugar-free sweetener when possible to avoid excessive calories.  o Eat a normal breakfast.  o   Set aside 15 minutes after breakfast to sit on the toilet, but do not strain to have a bowel movement.  o If you do not have a bowel movement by the third day, use an enema and repeat the above steps.   Use a wet wipe or moist toilet paper to wipe with Soak in warm water tub 3-4 times a day to relax sphincter muscle Take miralax if develop constipation

## 2013-03-28 NOTE — Progress Notes (Signed)
Subjective:     Patient ID: Joe Pearson, male   DOB: 11-Feb-1971, 42 y.o.   MRN: [id number]  HPI 42 year old male comes back in with concerns about his anal fissure. I initially saw him in August. He states that he had an episode of severe pain and discomfort in early November prompting him to go to the emergency room. He states that he has been doing significantly better since he has started to apply the diltiazem ointment twice a day. He is really not doing sitz baths. He is not taking any supplemental fiber. He is not drinking a lot of water. He states that he is having 2-3 bowel movements a day. He had one episode of blood in the commode. He denies any perianal itching or burning. He believes that he may have hemorrhoid now.  PMHx, PSHx, SOCHx, FAMHx, ALL reviewed and unchanged  Review of Systems 8 point ros performed and negative except for HPI    Objective:   Physical Exam  Constitutional: He appears well-developed and well-nourished.  HENT:  Head: Normocephalic and atraumatic.  Eyes: Conjunctivae are normal.  Neck: No tracheal deviation present.  Cardiovascular: Normal rate.   Pulmonary/Chest: Effort normal.  Abdominal: Soft. He exhibits no distension. There is no tenderness.  Genitourinary: Rectal exam shows fissure.     Smaller posterior midline anal fissure, nonthrombosed redundant right posterior lateral hemorrhoid. Digital rectal exam and anoscopy deferred due to presence of anal fissure  Neurological: He is alert.  Skin: Skin is warm and dry. He is not diaphoretic.  Psychiatric: He has a normal mood and affect. His behavior is normal. Judgment and thought content normal.   BP 120/70  Pulse 88  Resp 14  Ht 5\' 6"  (1.676 m)  Wt 187 lb (84.823 kg)  BMI 30.20 kg/m2     Assessment:     Anal fissure Nonthrombosed external hemorrhoids     Plan:     It appears that after his initial consultation that he did not follow medical advice. Now that he is applying to  diltiazem ointment twice a day he appears to be having some improvement. We rediscussed anal fissures. I reinforced the importance of stool softeners, a high fiber diet, fiber supplementation with Benefiber or Metamucil. We also discussed the importance of sitz baths to relax the sphincter to help with pain control. Since he has demonstrated some improvement and has not fully gone up to 6 week course of medical management we will continue with medical management for now. Followup in 6-8 weeks  Mary Sella. Andrey Campanile, MD, FACS General, Bariatric, & Minimally Invasive Surgery [facility], [place]

## 2013-04-18 ENCOUNTER — Encounter (INDEPENDENT_AMBULATORY_CARE_PROVIDER_SITE_OTHER): Payer: BC Managed Care – PPO | Admitting: General Surgery

## 2013-05-01 ENCOUNTER — Encounter (INDEPENDENT_AMBULATORY_CARE_PROVIDER_SITE_OTHER): Payer: BC Managed Care – PPO | Admitting: General Surgery

## 2013-05-07 ENCOUNTER — Encounter (INDEPENDENT_AMBULATORY_CARE_PROVIDER_SITE_OTHER): Payer: Self-pay | Admitting: General Surgery

## 2013-06-05 ENCOUNTER — Encounter (INDEPENDENT_AMBULATORY_CARE_PROVIDER_SITE_OTHER): Payer: BC Managed Care – PPO | Admitting: General Surgery

## 2013-06-12 ENCOUNTER — Encounter (INDEPENDENT_AMBULATORY_CARE_PROVIDER_SITE_OTHER): Payer: Self-pay | Admitting: General Surgery

## 2013-12-03 ENCOUNTER — Encounter (HOSPITAL_COMMUNITY): Payer: Self-pay | Admitting: Emergency Medicine

## 2013-12-03 ENCOUNTER — Emergency Department (HOSPITAL_COMMUNITY)
Admission: EM | Admit: 2013-12-03 | Discharge: 2013-12-03 | Disposition: A | Discharge status: Home / Self Care | Payer: Self-pay | Attending: Emergency Medicine | Admitting: Emergency Medicine

## 2013-12-03 DIAGNOSIS — J029 Acute pharyngitis, unspecified: Secondary | ICD-10-CM | POA: Insufficient documentation

## 2013-12-03 DIAGNOSIS — R059 Cough, unspecified: Secondary | ICD-10-CM

## 2013-12-03 DIAGNOSIS — R05 Cough: Secondary | ICD-10-CM

## 2013-12-03 LAB — CBG MONITORING, ED: Glucose-Capillary: 116 mg/dL — ABNORMAL HIGH (ref 70–99)

## 2013-12-03 MED ORDER — HYDROCODONE-ACETAMINOPHEN 5-325 MG PO TABS
1.0000 | ORAL_TABLET | Freq: Four times a day (QID) | ORAL | Status: DC | PRN
Start: 2013-12-03 — End: 2014-07-29

## 2013-12-03 MED ORDER — IBUPROFEN 600 MG PO TABS: 600.0000 mg | ORAL_TABLET | Freq: Three times a day (TID) | ORAL | Status: DC | PRN

## 2013-12-03 MED ORDER — ALBUTEROL SULFATE HFA 108 (90 BASE) MCG/ACT IN AERS
2.0000 | INHALATION_SPRAY | Freq: Once | RESPIRATORY_TRACT | Status: AC
  Administered 2013-12-03: 2 via RESPIRATORY_TRACT
  Filled 2013-12-03: qty 6.7

## 2013-12-03 MED ORDER — ALBUTEROL SULFATE (2.5 MG/3ML) 0.083% IN NEBU: 5.0000 mg | INHALATION_SOLUTION | Freq: Once | RESPIRATORY_TRACT | Status: DC

## 2013-12-03 MED ORDER — OXYCODONE-ACETAMINOPHEN 5-325 MG PO TABS
2.0000 | ORAL_TABLET | Freq: Once | ORAL | Status: AC
  Administered 2013-12-03: 2 via ORAL
  Filled 2013-12-03: qty 2

## 2013-12-03 NOTE — ED Provider Notes (Signed)
CSN: [id number]     Arrival date & time 12/03/13  0032 History   First MD Initiated Contact with Patient 12/03/13 0103     Chief Complaint  Patient presents with  . Sore Throat  . Neck Pain      HPI Patient reports mild sore throat over the past 2 days.  He reports the throat feels more sore on the left side.  He continues to feel the heat and drink fine.  Does report nonproductive dry cough.  Denies shortness of breath.  No fevers or chills.  Son in the ER with similar symptoms.  No ear pain.  He does have some mild headache and pain to the back of his head.  No neck pain or neck stiffness.  Symptoms are mild in severity.  Nothing worsens or improves his symptoms   History reviewed. No pertinent past medical history. Past Surgical History  Procedure Laterality Date  . Appendectomy    . 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 Sudden death Neg Hx   . Hyperlipidemia Neg Hx   . Hypertension Neg Hx   . Heart attack Neg Hx   . Diabetes Neg Hx   . Cancer Mother     breast   History  Substance Use Topics  . Smoking status: Never Smoker   . Smokeless tobacco: Never Used  . Alcohol Use: No    Review of Systems  All other systems reviewed and are negative.     Allergies  Review of patient's allergies indicates no known allergies.  Home Medications   Prior to Admission medications   Medication Sig Start Date End Date Taking? Authorizing Provider  guaiFENesin (MUCINEX) 600 MG 12 hr tablet Take 600 mg by mouth 2 (two) times daily.   Yes Historical Provider, MD  HYDROcodone-acetaminophen (NORCO/VICODIN) 5-325 MG per tablet Take 1 tablet by mouth every 6 (six) hours as needed for moderate pain. 12/03/13   Lyanne CoKevin M Chia Mowers, MD  ibuprofen (ADVIL,MOTRIN) 600 MG tablet Take 1 tablet (600 mg total) by mouth every 8 (eight) hours as needed. 12/03/13   Lyanne CoKevin M Misael Mcgaha, MD   BP 121/82  Pulse 64  Temp(Src) 97.8 F (36.6 C) (Oral)   Resp 22  Ht 5\' 6"  (1.676 m)  Wt 195 lb (88.451 kg)  BMI 31.49 kg/m2  SpO2 98% Physical Exam  Nursing note and vitals reviewed. Constitutional: He is oriented to person, place, and time. He appears well-developed and well-nourished.  HENT:  Head: Normocephalic.  Posterior pharynx normal.  Uvula midline.  Tolerating secretions.  No dental abnormalities.  Space under his tongue is soft  Eyes: EOM are normal.  Neck: Normal range of motion. Neck supple. No tracheal deviation present. No thyromegaly present.  Cardiovascular: Normal rate.   Pulmonary/Chest: Effort normal. No stridor.  Abdominal: He exhibits no distension.  Musculoskeletal: Normal range of motion.  Lymphadenopathy:    He has no cervical adenopathy.  Neurological: He is alert and oriented to person, place, and time.  Psychiatric: He has a normal mood and affect.    ED Course  Procedures (including critical care time) Labs Review Labs Reviewed  CBG MONITORING, ED - Abnormal; Notable for the following:    Glucose-Capillary 116 (*)    All other components within normal limits    Imaging Review No results found.   EKG Interpretation None      MDM   Final  diagnoses:  Pharyngitis  Cough    Patient feels better at this time.  Likely viral upper respiratory tract infection.  Coughing or without albuterol.  May represent bronchospasm.    Lyanne Co, MD 12/03/13 [phone]

## 2013-12-03 NOTE — ED Notes (Signed)
Pt states that he has a sore throat; states his throat feels swollen; pt c/o pain to the back of his neck and head and pt c/o not being able to focus his vision; pt states that the symptoms have progressively gotten worse as time goes on

## 2013-12-03 NOTE — ED Notes (Signed)
Pt sts that two days ago he began to have a sore throat, headache, and cough with painful lymph nodes under his chin and behind his ears. Pt also sts his vision "feels funny." Pt A&Ox4. NAD noted.

## 2013-12-03 NOTE — ED Notes (Signed)
CBG 116  

## 2013-12-03 NOTE — ED Notes (Signed)
Pt ambulatory to room.

## 2014-07-29 ENCOUNTER — Encounter (INDEPENDENT_AMBULATORY_CARE_PROVIDER_SITE_OTHER): Payer: Self-pay

## 2014-07-29 ENCOUNTER — Ambulatory Visit: Payer: Self-pay

## 2014-07-29 ENCOUNTER — Ambulatory Visit (INDEPENDENT_AMBULATORY_CARE_PROVIDER_SITE_OTHER): Payer: Self-pay | Admitting: Family Medicine

## 2014-07-29 DIAGNOSIS — M5412 Radiculopathy, cervical region: Secondary | ICD-10-CM

## 2014-07-29 MED ORDER — CYCLOBENZAPRINE HCL 10 MG PO TABS: 10.0000 mg | ORAL_TABLET | Freq: Three times a day (TID) | ORAL | Status: DC | PRN

## 2014-07-29 MED ORDER — PREDNISONE (PAK) 10 MG PO TABS: ORAL_TABLET | ORAL | Status: DC

## 2014-07-29 MED ORDER — HYDROCODONE-ACETAMINOPHEN 5-325 MG PO TABS: 1.0000 | ORAL_TABLET | Freq: Four times a day (QID) | ORAL | Status: DC | PRN

## 2014-07-29 NOTE — Patient Instructions (Signed)
You have cervical radiculopathy (a pinched nerve in the neck). Prednisone 6 day dose pack to relieve irritation/inflammation of the nerve. Aleve 2 tabs twice a day with food for pain and inflammation - start day AFTER finishing prednisone if prescribed this. Flexeril three times a day as needed for muscle spasms (can make you sleepy - if so do not drive while taking this). Norco for severe pain (no driving on this medicine). Simple range of motion exercises within limits of pain to prevent further stiffness. Consider physical therapy for stretching, exercises, traction, and modalities if improving. Heat 15 minutes at a time 3-4 times a day to help with spasms. Watch head position when on computers, texting, when sleeping in bed - should in line with back to prevent further nerve traction and irritation. Call me in 1 week to let me know how you're doing - if not improving would repeat the steroid injection you had before.

## 2014-07-30 NOTE — Assessment & Plan Note (Signed)
recurrence of radiculopathy likely at C6-7 again based on history, exam, prior MRI.  Discussed conservative measures vs repeating ESI - he'd like to try medications first.  Prednisone then aleve, flexeril and norco as needed.  Heat for spasms.  Ergonomic issues discussed.  Call in 1 week - if improving add physical therapy.  If not improving go ahead with repeat ESI.

## 2014-07-30 NOTE — Progress Notes (Signed)
PCP: No primary care provider on file.  Subjective:   HPI: Patient is a 44 y.o. male here for neck pain.  07/19/11: Patient denies known injury. States about 1 week ago he woke up with fairly severe right sided neck pain radiating into shoulder and down arm. Associated with numbness and tingling. No prior neck problems. No bowel/bladder dysfunction. Taking vicodin as needed for pain. Tried prednisone dose pack which helped some but pain has worsened since stopping this.  07/29/14: Patient returns with similar symptoms he experienced 3 years ago. Having left sided neck pain with radiation to left shoulder. No known injury or trauma. Pain started about a week ago. Had an ESI after last visit and improved following this. Left arm numbness associated with this. No bowel/bladder dysfunction.  No past medical history on file.  Current Outpatient Prescriptions on File Prior to Visit  Medication Sig Dispense Refill  . guaiFENesin (MUCINEX) 600 MG 12 hr tablet Take 600 mg by mouth 2 (two) times daily.    Marland Kitchen. ibuprofen (ADVIL,MOTRIN) 600 MG tablet Take 1 tablet (600 mg total) by mouth every 8 (eight) hours as needed. 15 tablet 0   No current facility-administered medications on file prior to visit.    Past Surgical History  Procedure Laterality Date  . Appendectomy    . 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 Marital Status: Single    Spouse Name: N/A  . Number of Children: N/A  . Years of Education: N/A   Occupational History  . Not on file.   Social History Main Topics  . Smoking status: Never Smoker   . Smokeless tobacco: Never Used  . Alcohol Use: No  . Drug Use: No  . Sexual Activity: Not on file   Other Topics Concern  . Not on file   Social History Narrative  . No narrative on file    Family History  Problem Relation Age of Onset  . Sudden death Neg Hx   . Hyperlipidemia Neg Hx   . Hypertension  Neg Hx   . Heart attack Neg Hx   . Diabetes Neg Hx   . Cancer Mother     breast    There were no vitals taken for this visit.  Review of Systems: See HPI above.    Objective:  Physical Exam:  Gen: NAD  Neck: No gross deformity, swelling, bruising. TTP left cervical paraspinal region.  No midline/bony TTP. Only 5 degrees extension - otherwise FROM. BUE strength 5/5 except 4/5 strength with left elbow flexion and extension, thumb opposition.   Sensation diminished left thumb and index fingers. 2+ equal reflexes in triceps, biceps, brachioradialis tendons. Negative spurlings.    Assessment & Plan:  1. Cervical radiculopathy - recurrence of radiculopathy likely at C6-7 again based on history, exam, prior MRI.  Discussed conservative measures vs repeating ESI - he'd like to try medications first.  Prednisone then aleve, flexeril and norco as needed.  Heat for spasms.  Ergonomic issues discussed.  Call in 1 week - if improving add physical therapy.  If not improving go ahead with repeat ESI.

## 2014-09-17 ENCOUNTER — Emergency Department (HOSPITAL_COMMUNITY)
Admission: EM | Admit: 2014-09-17 | Discharge: 2014-09-17 | Disposition: A | Discharge status: Home / Self Care | Payer: Self-pay | Attending: Emergency Medicine | Admitting: Emergency Medicine

## 2014-09-17 ENCOUNTER — Emergency Department (HOSPITAL_COMMUNITY): Payer: Self-pay

## 2014-09-17 ENCOUNTER — Encounter (HOSPITAL_COMMUNITY): Payer: Self-pay | Admitting: *Deleted

## 2014-09-17 DIAGNOSIS — R05 Cough: Secondary | ICD-10-CM | POA: Insufficient documentation

## 2014-09-17 DIAGNOSIS — R059 Cough, unspecified: Secondary | ICD-10-CM

## 2014-09-17 DIAGNOSIS — J45909 Unspecified asthma, uncomplicated: Secondary | ICD-10-CM | POA: Insufficient documentation

## 2014-09-17 DIAGNOSIS — R1011 Right upper quadrant pain: Secondary | ICD-10-CM | POA: Insufficient documentation

## 2014-09-17 DIAGNOSIS — R112 Nausea with vomiting, unspecified: Secondary | ICD-10-CM | POA: Insufficient documentation

## 2014-09-17 DIAGNOSIS — R0789 Other chest pain: Secondary | ICD-10-CM | POA: Insufficient documentation

## 2014-09-17 DIAGNOSIS — R109 Unspecified abdominal pain: Secondary | ICD-10-CM

## 2014-09-17 LAB — CBC WITH DIFFERENTIAL/PLATELET
Basophils Absolute: 0 10*3/uL (ref 0.0–0.1)
Basophils Relative: 0 % (ref 0–1)
Eosinophils Absolute: 0.2 10*3/uL (ref 0.0–0.7)
Eosinophils Relative: 2 % (ref 0–5)
HCT: 44.3 % (ref 39.0–52.0)
Hemoglobin: 14.7 g/dL (ref 13.0–17.0)
Lymphocytes Relative: 33 % (ref 12–46)
Lymphs Abs: 2.6 10*3/uL (ref 0.7–4.0)
MCH: 28.7 pg (ref 26.0–34.0)
MCHC: 33.2 g/dL (ref 30.0–36.0)
MCV: 86.5 fL (ref 78.0–100.0)
Monocytes Absolute: 0.5 10*3/uL (ref 0.1–1.0)
Monocytes Relative: 7 % (ref 3–12)
Neutro Abs: 4.4 10*3/uL (ref 1.7–7.7)
Neutrophils Relative %: 58 % (ref 43–77)
Platelets: 216 10*3/uL (ref 150–400)
RBC: 5.12 MIL/uL (ref 4.22–5.81)
RDW: 13.7 % (ref 11.5–15.5)
WBC: 7.8 10*3/uL (ref 4.0–10.5)

## 2014-09-17 LAB — COMPREHENSIVE METABOLIC PANEL
ALT: 20 U/L (ref 17–63)
AST: 17 U/L (ref 15–41)
Albumin: 4 g/dL (ref 3.5–5.0)
Alkaline Phosphatase: 58 U/L (ref 38–126)
Anion gap: 9 (ref 5–15)
BUN: 17 mg/dL (ref 6–20)
CO2: 25 mmol/L (ref 22–32)
Calcium: 9.3 mg/dL (ref 8.9–10.3)
Chloride: 106 mmol/L (ref 101–111)
Creatinine, Ser: 1.18 mg/dL (ref 0.61–1.24)
GFR calc Af Amer: >60 mL/min (ref >60)
GFR calc non Af Amer: >60 mL/min (ref >60)
Glucose, Bld: 95 mg/dL (ref 70–99)
Potassium: 3.7 mmol/L (ref 3.5–5.1)
Sodium: 140 mmol/L (ref 135–145)
Total Bilirubin: 0.5 mg/dL (ref 0.3–1.2)
Total Protein: 7 g/dL (ref 6.5–8.1)

## 2014-09-17 LAB — I-STAT TROPONIN, ED: Troponin i, poc: 0 ng/mL (ref 0.00–0.08)

## 2014-09-17 LAB — LIPASE, BLOOD: Lipase: 28 U/L (ref 22–51)

## 2014-09-17 MED ORDER — DM-GUAIFENESIN ER 30-600 MG PO TB12
1.0000 | ORAL_TABLET | Freq: Two times a day (BID) | ORAL | Status: DC
Start: 2014-09-17 — End: 2015-07-02

## 2014-09-17 NOTE — ED Notes (Signed)
hes afraid he has pneumonia

## 2014-09-17 NOTE — ED Notes (Signed)
The pt is c/o a cough for 5 days and he has sometimes a productive cough. No temp.    Hx of asthma   Some abd discomfort

## 2014-09-17 NOTE — ED Notes (Signed)
I gave the patient a cup of ice and a ginger-ale. 

## 2014-09-17 NOTE — ED Provider Notes (Signed)
CSN: [id number]     Arrival date & time 09/17/14  1541 History  This chart was scribed for non-physician practitioner working with Bethann BerkshireJoseph Zammit, MD by Richarda Overlieichard Holland, ED Scribe. This patient was seen in room TR05C/TR05C and the patient's care was started at 3:59 PM.   Chief Complaint  Patient presents with  . Cough   The history is provided by the patient. No language interpreter was used.   HPI Comments: Daria PasturesRobert M Freeburg is a 44 y.o. male with a history of asthma who presents to the Emergency Department complaining of cough for the last 5 days and says it is sometimes productive. Pt states that it is not productive currently. He reports that he has vomited from his coughing episodes but says he has not vomited today but feels nauseated. Pt states that he experiences CP and abdominal pain when he coughs. He says that laying down aggravates his cough. He states he has taken mucinex and ibuprofen at home with no relief. He denies any known sick contacts. Pt states that he does use an inhaler for his asthma because it is very mild. Pt reports NKDA. Pt says he does not have a PCP. He denies fever.   History reviewed. No pertinent past medical history. Past Surgical History  Procedure Laterality Date  . Appendectomy    . 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 Sudden death Neg Hx   . Hyperlipidemia Neg Hx   . Hypertension Neg Hx   . Heart attack Neg Hx   . Diabetes Neg Hx   . Cancer Mother     breast   History  Substance Use Topics  . Smoking status: Never Smoker   . Smokeless tobacco: Never Used  . Alcohol Use: No    Review of Systems  Constitutional: Negative for fever.  Respiratory: Positive for cough.   Gastrointestinal: Positive for nausea and vomiting.  All other systems reviewed and are negative.     Allergies  Review of patient's allergies indicates no known allergies.  Home Medications   Prior to Admission  medications   Medication Sig Start Date End Date Taking? Authorizing Provider  guaiFENesin (MUCINEX) 600 MG 12 hr tablet Take 600 mg by mouth 2 (two) times daily as needed for cough or to loosen phlegm.    Yes Historical Provider, MD  ibuprofen (ADVIL,MOTRIN) 200 MG tablet Take 200 mg by mouth every 6 (six) hours as needed for moderate pain.   Yes Historical Provider, MD  cyclobenzaprine (FLEXERIL) 10 MG tablet Take 1 tablet (10 mg total) by mouth every 8 (eight) hours as needed. Patient not taking: Reported on 09/17/2014 07/29/14   Lenda KelpShane R Hudnall, MD  dextromethorphan-guaiFENesin [facility](MUCINEX DM) 30-600 MG per 12 hr tablet Take 1 tablet by mouth 2 (two) times daily. Be sure to drink a large glass of water with each dose 09/17/14   Junius FinnerErin O'Malley, PA-C  HYDROcodone-acetaminophen (NORCO/VICODIN) 5-325 MG per tablet Take 1 tablet by mouth every 6 (six) hours as needed for moderate pain. Patient not taking: Reported on 09/17/2014 07/29/14   Lenda KelpShane R Hudnall, MD  ibuprofen (ADVIL,MOTRIN) 600 MG tablet Take 1 tablet (600 mg total) by mouth every 8 (eight) hours as needed. Patient not taking: Reported on 09/17/2014 12/03/13   Azalia BilisKevin Campos, MD  predniSONE (STERAPRED UNI-PAK) 10 MG tablet 6 tabs po day 1, 5 tabs po day 2, 4 tabs po day 3,  3 tabs po day 4, 2 tabs po day 5, 1 tab po day 6 Patient not taking: Reported on 09/17/2014 07/29/14   Lenda Kelp, MD   BP 147/89 mmHg  Pulse 72  Temp(Src) 98 F (36.7 C) (Oral)  Resp 16  Ht  (1.676 m)  Wt 213 lb (96.616 kg)  BMI 34.40 kg/m2  SpO2 99% Physical Exam  Constitutional: He is oriented to person, place, and time. He appears well-developed and well-nourished.  HENT:  Head: Normocephalic and atraumatic.  Right Ear: Hearing, tympanic membrane, external ear and ear canal normal.  Left Ear: Tympanic membrane and ear canal normal.  Mouth/Throat: Uvula is midline, oropharynx is clear and moist and mucous membranes are normal.  Eyes: Right eye exhibits no discharge.  Left eye exhibits no discharge.  Neck: Normal range of motion. Neck supple. No tracheal deviation present.  Cardiovascular: Normal rate, regular rhythm and normal heart sounds.   Pulmonary/Chest: Effort normal and breath sounds normal. No respiratory distress. He has no wheezes. He has no rales. He exhibits tenderness.  Mild tenderness to anterior Right lower chest wall  Abdominal: Soft. He exhibits no distension and no mass. There is tenderness. There is no rebound and no guarding.    Mild tenderness to RUQ vs lower aspect of anterior chest wall. No crepitus. No rebound or guarding. Abdomen is soft.  Neurological: He is alert and oriented to person, place, and time.  Skin: Skin is warm and dry.  Psychiatric: He has a normal mood and affect. His behavior is normal.  Nursing note and vitals reviewed.   ED Course  Procedures   DIAGNOSTIC STUDIES: Oxygen Saturation is 96% on RA, normal by my interpretation.    COORDINATION OF CARE: 4:04 PM Discussed treatment plan with pt at bedside and pt agreed to plan.  Labs Review Labs Reviewed  COMPREHENSIVE METABOLIC PANEL  CBC WITH DIFFERENTIAL/PLATELET  LIPASE, BLOOD  I-STAT TROPOININ, ED    Imaging Review Dg Chest 2 View  09/17/2014   CLINICAL DATA:  Cough for 5 days, right side chest pain  EXAM: CHEST  2 VIEW  COMPARISON:  11/06/2012  FINDINGS: The heart size and mediastinal contours are within normal limits. Both lungs are clear. The visualized skeletal structures are unremarkable.  IMPRESSION: No active cardiopulmonary disease.   Electronically Signed   By: Natasha Mead M.D.   On: 09/17/2014 16:18     EKG Interpretation   Date/Time:  Wednesday Sep 17 2014 16:23:40 EDT Ventricular Rate:  71 PR Interval:  148 QRS Duration: 90 QT Interval:  386 QTC Calculation: 419 R Axis:   37 Text Interpretation:  Normal sinus rhythm Normal ECG Confirmed by ZAMMIT   MD, JOSEPH [phone]) on 09/17/2014 6:05:18 PM      MDM   Final diagnoses:   Cough  Right sided abdominal pain  Right upper quadrant abdominal pain   Pt is a 44yo male presenting to ED with reports of a cough for 5 days with associated Right lower anterior chest pain and RUQ abdominal pain. Pt does have mild associated tenderness on exam.  Concern for pneumonia vs cholecystitis.  Doubt PE as pt is PERC negative, O2 Sat 96-99% on RA.    CXR: no active cardiopulmonary disease. No evidence of pneumonia.   6:00 PM: consulted with lab who stated labs will result in about 10 minutes.   6:33 PM  Pt anxious about labs.  Consulted with lab who stated machines keep breaking down, it may be  up to another 20 minutes for labs to result.    Labs results soon after last consult. Labs: unremarkable. No evidence of emergent process taking place at this time. No additional imaging indicated at this time.  Pain likely musculoskeletal in nature due to recent coughing.  Advised to use acetaminophen, ibuprofen, and alternate hot and cold backs.    Advised to f/u with PCP in 3-4 days if not improving. Return precautions provided. Pt verbalized understanding and agreement with tx plan.   I personally performed the services described in this documentation, which was scribed in my presence. The recorded information has been reviewed and is accurate.      Junius Finnerrin O'Malley, PA-C 09/17/14 1926  Bethann BerkshireJoseph Zammit, MD 09/18/14 1322

## 2014-10-24 ENCOUNTER — Encounter: Payer: Self-pay | Admitting: Internal Medicine

## 2014-10-24 ENCOUNTER — Ambulatory Visit: Payer: Self-pay | Admitting: Internal Medicine

## 2014-11-20 ENCOUNTER — Other Ambulatory Visit: Payer: Self-pay | Admitting: Internal Medicine

## 2014-12-09 ENCOUNTER — Encounter (HOSPITAL_COMMUNITY): Payer: Self-pay | Admitting: Emergency Medicine

## 2014-12-09 ENCOUNTER — Emergency Department (HOSPITAL_COMMUNITY)
Admission: EM | Admit: 2014-12-09 | Discharge: 2014-12-09 | Disposition: A | Discharge status: Home / Self Care | Payer: Self-pay | Attending: Emergency Medicine | Admitting: Emergency Medicine

## 2014-12-09 DIAGNOSIS — L02415 Cutaneous abscess of right lower limb: Secondary | ICD-10-CM | POA: Insufficient documentation

## 2014-12-09 DIAGNOSIS — Z79899 Other long term (current) drug therapy: Secondary | ICD-10-CM | POA: Insufficient documentation

## 2014-12-09 DIAGNOSIS — L0291 Cutaneous abscess, unspecified: Secondary | ICD-10-CM

## 2014-12-09 MED ORDER — SULFAMETHOXAZOLE-TRIMETHOPRIM 800-160 MG PO TABS: 1.0000 | ORAL_TABLET | Freq: Two times a day (BID) | ORAL | Status: AC

## 2014-12-09 MED ORDER — LIDOCAINE-EPINEPHRINE (PF) 2 %-1:200000 IJ SOLN
10.0000 mL | Freq: Once | INTRAMUSCULAR | Status: AC
  Administered 2014-12-09: 10 mL
  Filled 2014-12-09: qty 20

## 2014-12-09 NOTE — ED Notes (Signed)
Pt has abscess on right inner thigh.

## 2014-12-09 NOTE — Discharge Instructions (Signed)
Please read and follow all provided instructions.  Your diagnoses today include:  1. Abscess     Tests performed today include:  Vital signs. See below for your results today.   Medications prescribed:   Bactrim (trimethoprim/sulfamethoxazole) - antibiotic  You have been prescribed an antibiotic medicine: take the entire course of medicine even if you are feeling better. Stopping early can cause the antibiotic not to work.  Take any prescribed medications only as directed.   Home care instructions:   Follow any educational materials contained in this packet  Follow-up instructions: \Return to the Emergency Department in 48 hours for a recheck if your symptoms are not significantly improved.  Please follow-up with your primary care provider in the next 1 week for further evaluation of your symptoms.   Return instructions:  Return to the Emergency Department if you have:  Fever  Worsening symptoms  Worsening pain  Worsening swelling  Redness of the skin that moves away from the affected area, especially if it streaks away from the affected area   Any other emergent concerns  Your vital signs today were: BP 123/83 mmHg   Pulse 70   Temp(Src) 98.3 F (36.8 C)   Resp 16   Ht  (1.676 m)   Wt 215 lb 14.4 oz (97.932 kg)   BMI 34.86 kg/m2   SpO2 98% If your blood pressure (BP) was elevated above 135/85 this visit, please have this repeated by your doctor within one month. --------------

## 2014-12-09 NOTE — ED Notes (Signed)
Boil on back of right leg  For 3 days

## 2014-12-09 NOTE — ED Provider Notes (Signed)
CSN: [id number]     Arrival date & time 12/09/14  1357 History  This chart was scribed for non-physician practitioner, Renne Crigler, PA-C, working with Blane Ohara, MD by Charline Bills, ED Scribe. This patient was seen in room TR02C/TR02C and the patient's care was started at 2:22 PM.   Chief Complaint  Patient presents with  . Abscess   The history is provided by the patient. No language interpreter was used.   HPI Comments: Joe Pearson is a 44 y.o. male who presents to the Emergency Department complaining of a gradually worsening, painful abscess to right inner thigh first noticed 2 days ago. Pain is exacerbated with palpation. Pt reports that he has tried squeezing the abscess without relief. He reports h/o abscess. Pt denies fever and chills. No h/o DM. No known medical allergies.   History reviewed. No pertinent past medical history. Past Surgical History  Procedure Laterality Date  . Appendectomy    . 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 Sudden death Neg Hx   . Hyperlipidemia Neg Hx   . Hypertension Neg Hx   . Heart attack Neg Hx   . Diabetes Neg Hx   . Cancer Mother     breast   History  Substance Use Topics  . Smoking status: Never Smoker   . Smokeless tobacco: Never Used  . Alcohol Use: No    Review of Systems  Constitutional: Negative for fever and chills.  Gastrointestinal: Negative for nausea and vomiting.  Skin: Negative for color change.       + Abscess  Hematological: Negative for adenopathy.   Allergies  Review of patient's allergies indicates no known allergies.  Home Medications   Prior to Admission medications   Medication Sig Start Date End Date Taking? Authorizing Provider  cyclobenzaprine (FLEXERIL) 10 MG tablet Take 1 tablet (10 mg total) by mouth every 8 (eight) hours as needed. Patient not taking: Reported on 09/17/2014 07/29/14   Lenda Kelp, MD   dextromethorphan-guaiFENesin [facility] DM) 30-600 MG per 12 hr tablet Take 1 tablet by mouth 2 (two) times daily. Be sure to drink a large glass of water with each dose 09/17/14   Junius Finner, PA-C  guaiFENesin (MUCINEX) 600 MG 12 hr tablet Take 600 mg by mouth 2 (two) times daily as needed for cough or to loosen phlegm.     Historical Provider, MD  HYDROcodone-acetaminophen (NORCO/VICODIN) 5-325 MG per tablet Take 1 tablet by mouth every 6 (six) hours as needed for moderate pain. Patient not taking: Reported on 09/17/2014 07/29/14   Lenda Kelp, MD  ibuprofen (ADVIL,MOTRIN) 200 MG tablet Take 200 mg by mouth every 6 (six) hours as needed for moderate pain.    Historical Provider, MD  ibuprofen (ADVIL,MOTRIN) 600 MG tablet Take 1 tablet (600 mg total) by mouth every 8 (eight) hours as needed. Patient not taking: Reported on 09/17/2014 12/03/13   Azalia Bilis, MD  predniSONE (STERAPRED UNI-PAK) 10 MG tablet 6 tabs po day 1, 5 tabs po day 2, 4 tabs po day 3, 3 tabs po day 4, 2 tabs po day 5, 1 tab po day 6 Patient not taking: Reported on 09/17/2014 07/29/14   Lenda Kelp, MD   BP 123/83 mmHg  Pulse 70  Temp(Src) 98.3 F (36.8 C)  Resp 16  Ht 5\' 6"  (1.676 m)  Wt 215 lb 14.4 oz (97.932 kg)  BMI 34.86 kg/m2  SpO2 98% Physical Exam  Constitutional: He appears well-developed and well-nourished. No distress.  HENT:  Head: Normocephalic and atraumatic.  Eyes: Conjunctivae and EOM are normal.  Neck: Normal range of motion. Neck supple. No tracheal deviation present.  Cardiovascular: Normal rate.   Pulmonary/Chest: Effort normal. No respiratory distress.  Musculoskeletal: Normal range of motion.  Neurological: He is alert.  Skin: Skin is warm and dry.  Patient with 1-2 cm induration noted to the right medial thigh with several centimeters of surrounding cellulitis. No active drainage.  Psychiatric: He has a normal mood and affect. His behavior is normal.  Nursing note and vitals reviewed.  ED  Course  Procedures (including critical care time) DIAGNOSTIC STUDIES: Oxygen Saturation is 98% on RA, normal by my interpretation.    INCISION AND DRAINAGE PROCEDURE NOTE: Patient identification was confirmed and verbal consent was obtained. This procedure was performed by Renne Crigler, PA-C at 2:29 PM. Site: right thigh Sterile procedures observed Needle size: 25 gauge Anesthetic used (type and amt): 2% lidocaine with epinephrine  Blade size: 11 Drainage: small amount, purulent Complexity: Complex Site anesthetized, incision made over site, wound drained and explored loculations, covered with dry, sterile dressing.   Pt tolerated procedure well without complications. Instructions for care discussed verbally and pt provided with additional written instructions for homecare and f/u.  COORDINATION OF CARE: 2:25 PM-Discussed treatment plan which includes I&D with pt at bedside and pt agreed to plan.   Labs Review Labs Reviewed - No data to display  Imaging Review No results found.   EKG Interpretation None        Vital signs reviewed and are as follows: BP 123/83 mmHg  Pulse 70  Temp(Src) 98.3 F (36.8 C)  Resp 16  Ht  (1.676 m)  Wt 215 lb 14.4 oz (97.932 kg)  BMI 34.86 kg/m2  SpO2 98%  2:45 PM The patient was urged to return to the Emergency Department urgently with worsening pain, swelling, expanding erythema especially if it streaks away from the affected area, fever, or if they have any other concerns.   The patient was urged to return to the Emergency Department or go to their PCP in 48 hours for wound recheck if the area is not significantly improved.  The patient verbalized understanding and stated agreement with this plan.      MDM   Final diagnoses:  Abscess   Patient with skin abscess amenable to incision and drainage. As there are signs of cellulitis, will treat with Bactrim. Patient appears well, nontoxic. I&D performed without  complication.  I personally performed the services described in this documentation, which was scribed in my presence. The recorded information has been reviewed and is accurate.   Renne Crigler, PA-C 12/09/14 1446  Blane Ohara, MD 12/09/14 [phone]

## 2015-02-04 ENCOUNTER — Ambulatory Visit: Payer: Self-pay | Admitting: Internal Medicine

## 2015-02-04 ENCOUNTER — Ambulatory Visit: Payer: Self-pay

## 2015-02-10 ENCOUNTER — Ambulatory Visit: Payer: Self-pay

## 2015-02-18 ENCOUNTER — Ambulatory Visit: Payer: Self-pay

## 2015-02-25 ENCOUNTER — Encounter: Payer: Self-pay | Admitting: Internal Medicine

## 2015-02-25 ENCOUNTER — Ambulatory Visit (INDEPENDENT_AMBULATORY_CARE_PROVIDER_SITE_OTHER): Payer: Self-pay | Admitting: Internal Medicine

## 2015-02-25 VITALS — BP 129/71 | HR 72 | Temp 98.0°F | Wt 218.2 lb

## 2015-02-25 DIAGNOSIS — Z0000 Encounter for general adult medical examination without abnormal findings: Secondary | ICD-10-CM | POA: Insufficient documentation

## 2015-02-25 DIAGNOSIS — J069 Acute upper respiratory infection, unspecified: Secondary | ICD-10-CM

## 2015-02-25 DIAGNOSIS — M5412 Radiculopathy, cervical region: Secondary | ICD-10-CM

## 2015-02-25 NOTE — Patient Instructions (Addendum)
A referral to [facility] has been sent for you to possibly receive injections similar to what you have had in the past.  A referral was also sent for you to have another MRI of your cervical spine.  The imaging department will contact you to set up an appointment time.    Cervical Radiculopathy Cervical radiculopathy means that a nerve in the neck is pinched or bruised. This can cause pain or loss of feeling (numbness) that runs from your neck to your arm and fingers. HOME CARE Managing Pain  Take over-the-counter and prescription medicines only as told by your doctor.  If directed, put ice on the injured or painful area.  Put ice in a plastic bag.  Place a towel between your skin and the bag.  Leave the ice on for 20 minutes, 2-3 times per day.  If ice does not help, you can try using heat. Take a warm shower or warm bath, or use a heat pack as told by your doctor.  You may try a gentle neck and shoulder massage. Activity  Rest as needed. Follow instructions from your doctor about any activities to avoid.  Do exercises as told by your doctor or physical therapist. General Instructions   If you were given a soft collar, wear it as told by your doctor.  Use a flat pillow when you sleep.  Keep all follow-up visits as told by your doctor. This is important. GET HELP IF:  Your condition does not improve with treatment. GET HELP RIGHT AWAY IF:   Your pain gets worse and is not controlled with medicine.  You lose feeling or feel weak in your hand, arm, face, or leg.  You have a fever.  You have a stiff neck.  You cannot control when you poop or pee (have incontinence).  You have trouble with walking, balance, or talking.   This information is not intended to replace advice given to you by your health care provider. Make sure you discuss any questions you have with your health care provider.   Document Released: 04/21/2011 Document Revised: 01/21/2015 Document  Reviewed: 06/26/2014 Elsevier Interactive Patient Education 2016 [employer] Inc[employer]   Viral Infections A virus is a type of germ. Viruses can cause:  Minor sore throats.  Aches and pains.  Headaches.  Runny nose.  Rashes.  Watery eyes.  Tiredness.  Coughs.  Loss of appetite.  Feeling sick to your stomach (nausea).  Throwing up (vomiting).  Watery poop (diarrhea). HOME CARE   Only take medicines as told by your doctor.  Drink enough water and fluids to keep your pee (urine) clear or pale yellow. Sports drinks are a good choice.  Get plenty of rest and eat healthy. Soups and broths with crackers or rice are fine. GET HELP RIGHT AWAY IF:   You have a very bad headache.  You have shortness of breath.  You have chest pain or neck pain.  You have an unusual rash.  You cannot stop throwing up.  You have watery poop that does not stop.  You cannot keep fluids down.  You or your child has a temperature by mouth above 102 F (38.9 C), not controlled by medicine.  Your baby is older than 3 months with a rectal temperature of 102 F (38.9 C) or higher.  Your baby is 883 months old or younger with a rectal temperature of 100.4 F (38 C) or higher. MAKE SURE YOU:   Understand these instructions.  Will watch this  condition.  Will get help right away if you are not doing well or get worse.   This information is not intended to replace advice given to you by your health care provider. Make sure you discuss any questions you have with your health care provider.   Document Released: 04/14/2008 Document Revised: 07/25/2011 Document Reviewed: 10/08/2014 Elsevier Interactive Patient Education [employer]

## 2015-02-25 NOTE — Assessment & Plan Note (Signed)
Assessment: Patient complains today of 2 day history of non-productive cough and sore throat.  Reports sick contacts at home with similar symptoms.  Does have prior hx of seasonal allergies but denies any sinus tenderness which he normally has with allergies.  No fevers, chills, nausea, vomiting.  No SOB, CP.  Plan: -suspect viral URI and recommend supportive care -plenty of fluids, salt water gargle, cough syrup prn or lozenges prn -RTC if symptoms do not improve in 5-7 days

## 2015-02-25 NOTE — Assessment & Plan Note (Signed)
Assessment: He complains to day of left-sided cervical radiculopathy pain.  In 2013, patient was seen for similar right-sided complaints by Sports Medicine.  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  At that time, patient tried conservative management and eventually was [facility] after failing conservative therapy.  Reports that steroid injections helped tremendously.  3 years later in March 2016, patient returned to Sports Medicine with similar type complaints, this time on the left.  He tried conservative management again but states that has been ineffective and is hoping for referral to [facility] for injections.  States that pain originates in his cervical spine and feels like numbness and tingling down his entire left arm to about mid-forearm.  Reports throbbing at night when lying on that side.  Denies any loss of sensation or strength in the extremity.  No rash.  Patient has full passive ROM but active ROM is limited to just past 90 degrees with flexion of the shoulder due to pain.  Extension is more limited.  Plan: -repeat cervical MRI and refer to [facility] for possible repeat steroid injection

## 2015-02-25 NOTE — Assessment & Plan Note (Signed)
-  Patient declines flu and Tdap today. -Patient declines HIV and RPR screening. -Will check lipids today and follow up.

## 2015-02-25 NOTE — Progress Notes (Signed)
Patient ID: Joe Pearson, male   DOB: 08-Nov-1970, 44 y.o.   MRN: [id number]   Subjective:   Patient ID: Joe Pearson male   DOB: 08-Nov-1970 44 y.o.   MRN: [id number]  HPI: [name] is a 44 y.o. male with past medical history of cervical radiculopathy who presents to [facility] today to establish care.  He complains to day of left-sided cervical radiculopathy pain.  In 2013, patient was seen for similar right-sided complaints by Sports Medicine.  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  At that time, patient tried conservative management and eventually was [facility] after failing conservative therapy.  Reports that steroid injections helped tremendously.  3 years later in March 2016, patient returned to Sports Medicine with similar type complaints, this time on the left.  He tried conservative management again but states that has been ineffective and is hoping for referral to [facility] for injections.  States that pain originates in his cervical spine and feels like numbness and tingling down his entire left arm to about mid-forearm.  Reports throbbing at night when lying on that side.  Denies any loss of sensation or strength in the extremity.  No rash.  Patient has full passive ROM but active ROM is limited to just past 90 degrees with flexion of the shoulder due to pain.  Extension is more limited.  Patient also complains today of 2 day history of non-productive cough and sore throat.  Reports sick contacts at home with similar symptoms.  Does have prior hx of seasonal allergies but denies any sinus tenderness which he normally has with allergies.  No fevers, chills, nausea, vomiting.  No SOB, CP.    No past medical history on file. Current Outpatient Prescriptions  Medication Sig Dispense Refill  . guaiFENesin (MUCINEX) 600 MG 12 hr  tablet Take 600 mg by mouth 2 (two) times daily as needed for cough or to loosen phlegm.     Marland Kitchen. dextromethorphan-guaiFENesin (MUCINEX DM) 30-600 MG per 12 hr tablet Take 1 tablet by mouth 2 (two) times daily. Be sure to drink a large glass of water with each dose (Patient not taking: Reported on 02/25/2015) 28 tablet 0  . ibuprofen (ADVIL,MOTRIN) 200 MG tablet Take 200 mg by mouth every 6 (six) hours as needed for moderate pain.     No current facility-administered medications for this visit.   Family History  Problem Relation Age of Onset  . Sudden death Neg Hx   . Hyperlipidemia Neg Hx   . Hypertension Neg Hx   . Heart attack Neg Hx   . Diabetes Neg Hx   . Cancer Mother     breast   Social History   Social History  . Marital Status: Single    Spouse Name: N/A  . Number of Children: N/A  . Years of Education: N/A   Social History Main Topics  . Smoking status: Never Smoker   . Smokeless tobacco: Never Used  . Alcohol Use: No  . Drug Use: No  . Sexual Activity: Not Asked   Other Topics Concern  . None   Social History Narrative   Review of Systems: Review of Systems  Constitutional: Negative for fever and chills.  HENT: Positive for sore throat. Negative for ear pain, postnasal drip, sinus pressure and sneezing.   Eyes: Negative for pain and discharge.  Respiratory: Positive  for cough. Negative for shortness of breath.   Cardiovascular: Negative for chest pain.  Gastrointestinal: Negative for nausea, vomiting and abdominal pain.  Genitourinary: Negative for dysuria and difficulty urinating.  Musculoskeletal: Positive for back pain and neck pain.  Neurological: Negative for dizziness and weakness.  Hematological: Negative for adenopathy.  Psychiatric/Behavioral: Negative for agitation.     Objective:  Physical Exam: Filed Vitals:   02/25/15 0923  BP: 129/71  Pulse: 72  Temp: 98 F (36.7 C)  TempSrc: Oral  Weight: 218 lb 3.2 oz (98.975 kg)  SpO2: 100%    Physical Exam  Constitutional: He is oriented to person, place, and time. He appears well-developed and well-nourished. No distress.  HENT:  Head: Normocephalic and atraumatic.  Mouth/Throat: Oropharynx is clear and moist. No oropharyngeal exudate.  Eyes: EOM are normal.  Cardiovascular: Normal rate, regular rhythm and normal heart sounds.   Respiratory: Effort normal and breath sounds normal.  GI: Soft. Bowel sounds are normal.  Musculoskeletal:  Left Upper Extremity: strength 5/5 proximally and distally, sensation intact, no swelling, normal passive ROM, active ROM limited past 90 degrees with shoulder flexion.   Right Upper Extremity: strength 5/5 proximally and distally, senstation intact, active and passive full ROM. Neck: tender to palpation of paraspinal muscles on left cervical spine, limited ROM with neck rotation and sidebending.  No obvious swelling or deformity.  Lymphadenopathy:    He has no cervical adenopathy.  Neurological: He is alert and oriented to person, place, and time.  Skin: Skin is warm and dry.  Psychiatric: He has a normal mood and affect.     Assessment & Plan:  Please see Problem List for Assessment and Plan.

## 2015-02-26 ENCOUNTER — Encounter: Payer: Self-pay | Admitting: Internal Medicine

## 2015-02-26 LAB — LIPID PANEL
CHOLESTEROL TOTAL: 158 mg/dL (ref 100–199)
Chol/HDL Ratio: 3.2 ratio units (ref 0.0–5.0)
HDL: 49 mg/dL (ref >39)
LDL CALC: 98 mg/dL (ref 0–99)
TRIGLYCERIDES: 56 mg/dL (ref 0–149)
VLDL Cholesterol Cal: 11 mg/dL (ref 5–40)

## 2015-02-28 NOTE — Progress Notes (Signed)
Internal Medicine Clinic Attending  I saw and evaluated the patient.  I personally confirmed the key portions of the history and exam documented by Dr. Wallace and I reviewed pertinent patient test results.  The assessment, diagnosis, and plan were formulated together and I agree with the documentation in the resident's note. 

## 2015-03-20 ENCOUNTER — Telehealth: Payer: Self-pay | Admitting: *Deleted

## 2015-03-20 DIAGNOSIS — M542 Cervicalgia: Secondary | ICD-10-CM

## 2015-03-20 NOTE — Telephone Encounter (Signed)
Call made to [facility]-pt needs to be scheduled for MRI and ESI. Both can be done at [facility].  MRI order is viewable by radiology, but the ambulatory referral to interventional radiology needs to be changed to [facility] and be more specific about location.  Will send to ordering MD for review, please advise.Kingsley SpittleGoldston, Darlene Cassady11/4/20164:57 PM

## 2015-03-22 NOTE — Telephone Encounter (Signed)
Yes, I do.  Sorry for the mix-up!

## 2015-03-23 NOTE — Telephone Encounter (Signed)
MRI order faxed to [facility] per their request fax# [phone].  The office will contact pt directly with an appointment.  Pt will also have an epidurography scheduled after mri has been completed.Kingsley SpittleGoldston, Shambhavi Salley Cassady11/7/20164:44 PM

## 2015-03-23 NOTE — Telephone Encounter (Signed)
Order signed.

## 2015-03-23 NOTE — Addendum Note (Signed)
Addended by: Neomia DearPOWERS, Amere Bricco E on: 03/23/2015 04:13 PM   Modules accepted: Orders

## 2015-04-01 NOTE — Telephone Encounter (Signed)
Call made to pt to make sure he was aware of upcoming MRI and Epidurography.  Pt aware, phone call complete.Kingsley SpittleGoldston, Darlene Cassady11/16/20163:11 PM

## 2015-04-02 ENCOUNTER — Ambulatory Visit
Admission: RE | Admit: 2015-04-02 | Discharge: 2015-04-02 | Disposition: A | Discharge status: Home / Self Care | Payer: No Typology Code available for payment source | Source: Ambulatory Visit | Attending: Internal Medicine | Admitting: Internal Medicine

## 2015-04-02 DIAGNOSIS — M5412 Radiculopathy, cervical region: Secondary | ICD-10-CM

## 2015-04-03 ENCOUNTER — Ambulatory Visit
Admission: RE | Admit: 2015-04-03 | Discharge: 2015-04-03 | Disposition: A | Discharge status: Home / Self Care | Payer: No Typology Code available for payment source | Source: Ambulatory Visit | Attending: Internal Medicine | Admitting: Internal Medicine

## 2015-04-03 DIAGNOSIS — M542 Cervicalgia: Secondary | ICD-10-CM

## 2015-04-03 MED ORDER — IOHEXOL 300 MG/ML  SOLN
1.0000 mL | Freq: Once | INTRAMUSCULAR | Status: DC | PRN
  Administered 2015-04-03: 1 mL via EPIDURAL

## 2015-04-03 MED ORDER — TRIAMCINOLONE ACETONIDE 40 MG/ML IJ SUSP (RADIOLOGY)
60.0000 mg | Freq: Once | INTRAMUSCULAR | Status: AC
  Administered 2015-04-03: 60 mg via EPIDURAL

## 2015-04-03 MED ORDER — IOHEXOL 300 MG/ML  SOLN: 1.0000 mL | Freq: Once | INTRAMUSCULAR | Status: DC | PRN

## 2015-04-03 MED ORDER — TRIAMCINOLONE ACETONIDE 40 MG/ML IJ SUSP (RADIOLOGY): 60.0000 mg | Freq: Once | INTRAMUSCULAR | Status: DC

## 2015-04-03 NOTE — Discharge Instructions (Signed)

## 2015-04-24 ENCOUNTER — Ambulatory Visit (INDEPENDENT_AMBULATORY_CARE_PROVIDER_SITE_OTHER): Payer: No Typology Code available for payment source | Admitting: Internal Medicine

## 2015-04-24 ENCOUNTER — Ambulatory Visit: Payer: No Typology Code available for payment source | Admitting: Internal Medicine

## 2015-04-24 VITALS — BP 128/79 | HR 73 | Temp 98.3°F | Wt 214.4 lb

## 2015-04-24 DIAGNOSIS — K602 Anal fissure, unspecified: Secondary | ICD-10-CM

## 2015-04-24 MED ORDER — LIDOCAINE HCL 2 % EX GEL: 1.0000 "application " | CUTANEOUS | Status: DC | PRN

## 2015-04-24 MED ORDER — DOCUSATE SODIUM 100 MG PO CAPS: 100.0000 mg | ORAL_CAPSULE | Freq: Every day | ORAL | Status: AC | PRN

## 2015-04-24 MED ORDER — NITROGLYCERIN 0.4 % RE OINT: 1.0000 [drp] | TOPICAL_OINTMENT | Freq: Two times a day (BID) | RECTAL | Status: DC

## 2015-04-24 NOTE — Patient Instructions (Addendum)
I have sent 3 prescriptions to your pharmacy: - Nitrogylcerine ointment to use twice per day for 6 weeks - Colace to take nightly.  This is a stool softener - Lidocaine jelly to help with pain relief  GETTING TO GOOD BOWEL HEALTH. Irregular bowel habits such as constipation and diarrhea can lead to many problems over time. Having one soft bowel movement a day is the most important way to prevent further problems. The anorectal canal is designed to handle stretching and feces to safely manage our ability to get rid of solid waste (feces, poop, stool) out of our body. BUT, hard constipated stools can act like ripping concrete bricks and diarrhea can be a burning fire to this very sensitive area of our body, causing inflamed hemorrhoids, anal fissures, increasing risk is perirectal abscesses, abdominal pain/bloating, an making irritable bowel worse.  The goal: ONE SOFT BOWEL MOVEMENT A DAY! To have soft, regular bowel movements:   Drink at least 8 tall glasses of water a day.   Take plenty of fiber. Fiber is the undigested part of plant food that passes into the colon, acting s "natures broom" to encourage bowel motility and movement. Fiber can absorb and hold large amounts of water. This results in a larger, bulkier stool, which is soft and easier to pass. Work gradually over several weeks up to 6 servings a day of fiber (25g a day even more if needed) in the form of:  Vegetables -- Root (potatoes, carrots, turnips), leafy green (lettuce, salad greens, celery, spinach), or cooked high residue (cabbage, broccoli, etc)  Fruit -- Fresh (unpeeled skin & pulp), Dried (prunes, apricots, cherries, etc ), or stewed ( applesauce)   Whole grain breads, pasta, etc (whole wheat)   Bran cereals   Bulking Agents -- This type of water-retaining fiber generally is easily obtained each day by one of the following:   Psyllium bran -- The psyllium plant is remarkable because its ground seeds can  retain so much water. This product is available as Metamucil, Konsyl, Effersyllium, Per Diem Fiber, or the less expensive generic preparation in drug and health food stores. Although labeled a laxative, it really is not a laxative.   Methylcellulose -- This is another fiber derived from wood which also retains water. It is available as Citrucel.  Polyethylene Glycol - and "artificial" fiber commonly called Miralax or Glycolax. It is helpful for people with gassy or bloated feelings with regular fiber  Flax Seed - a less gassy fiber than psyllium  No reading or other relaxing activity while on the toilet. If bowel movements take longer than 5 minutes, you are too constipated  AVOID CONSTIPATION. High fiber and water intake usually takes care of this. Sometimes a laxative is needed to stimulate more frequent bowel movements, but   Laxatives are not a good long-term solution as it can wear the colon out.  Osmotics (Milk of Magnesia, Fleets phosphosoda, Magnesium citrate, MiraLax, GoLytely) are safer than   Stimulants (Senokot, Castor Oil, Dulcolax, Ex Lax)   Do not take laxatives for more than 7days in a row.  IF SEVERELY CONSTIPATED, try a Bowel Retraining Program:  Do not use laxatives.   Eat a diet high in roughage, such as bran cereals and leafy vegetables.   Drink six (6) ounces of prune or apricot juice each morning.   Eat two (2) large servings of stewed fruit each day.   Take one (1) heaping tablespoon of a psyllium-based bulking agent twice a day. Use sugar-free  sweetener when possible to avoid excessive calories.   Eat a normal breakfast.   Set aside 15 minutes after breakfast to sit on the toilet, but do not strain to have a bowel movement.   If you do not have a bowel movement by the third day, use an enema and repeat the above steps.  Use a wet wipe or moist toilet paper to wipe with Soak in warm water tub 3-4 times a day to relax sphincter muscle Take  miralax if develop constipation

## 2015-04-24 NOTE — Assessment & Plan Note (Signed)
Assessment: Patient presents today with 1 week of symptoms similar to 2 years ago when he was treated for anal fissure.  Describes constipation, anal pain with bowel movement and spasm.  Previously seen by general surgery for this a couple years back and was treated non-operatively with resolution of symptoms after approximately 8 weeks.  Plan: - rx sent for nitroglycerin 0.4% ointment bid x 6-8 weeks - rx sent for Colace and Lidocaine jelly for symptomatic treatment - patient instructed to increase fiber, drink more water, do sitz baths - rtc in 4 weeks if not improving

## 2015-04-24 NOTE — Progress Notes (Signed)
Patient ID: Joe Pearson, male   DOB: 1970/06/05, 44 y.o.   MRN: [id number]   Subjective:   Patient ID: Joe Pearson male   DOB: 1970/06/05 44 y.o.   MRN: [id number]  HPI: [name] is a 44 y.o. male who presents today for complaint of anal pain and constipation for approximately 1 week.  States that he has had similar symptoms like this approximately 2 years ago and was seen by general surgery (Dr. Andrey CampanileWilson).  He was treated at that time for anal fissures with topical nifedipine and noted resolution of symptoms after approximately 8 weeks.  His symptoms currently are similar to what he previously experienced.  States he was pain and spasm with bowel movements and wiping.  States there is a minimal amount of blood on toilet paper.  No melena or BRBPR.  No personal or family history of colorectal cancer.  States his stool are hard and he has constipation.  No other complaints today.  Patient does not have a particularly high fiber diet and has not been using any stool softeners or fiber supplements.    No past medical history on file. Current Outpatient Prescriptions  Medication Sig Dispense Refill  . dextromethorphan-guaiFENesin (MUCINEX DM) 30-600 MG per 12 hr tablet Take 1 tablet by mouth 2 (two) times daily. Be sure to drink a large glass of water with each dose (Patient not taking: Reported on 02/25/2015) 28 tablet 0  . docusate sodium (COLACE) 100 MG capsule Take 1 capsule (100 mg total) by mouth daily as needed. 30 capsule 1  . guaiFENesin (MUCINEX) 600 MG 12 hr tablet Take 600 mg by mouth 2 (two) times daily as needed for cough or to loosen phlegm.     Marland Kitchen. ibuprofen (ADVIL,MOTRIN) 200 MG tablet Take 200 mg by mouth every 6 (six) hours as needed for moderate pain.    Marland Kitchen. lidocaine (XYLOCAINE) 2 % jelly Apply 1 application topically as needed. 30 mL 0  . Nitroglycerin 0.4 % OINT Place 1-2 drops rectally 2 (two) times daily. 1 Tube 0   No current facility-administered medications  for this visit.   Family History  Problem Relation Age of Onset  . Sudden death Neg Hx   . Hyperlipidemia Neg Hx   . Hypertension Neg Hx   . Heart attack Neg Hx   . Diabetes Neg Hx   . Cancer Mother     breast   Social History   Social History  . Marital Status: Single    Spouse Name: N/A  . Number of Children: N/A  . Years of Education: N/A   Social History Main Topics  . Smoking status: Never Smoker   . Smokeless tobacco: Never Used  . Alcohol Use: No  . Drug Use: No  . Sexual Activity: Not on file   Other Topics Concern  . Not on file   Social History Narrative   Review of Systems: Review of Systems  Constitutional: Negative for fever and chills.  HENT: Negative for congestion and sore throat.   Eyes: Negative for blurred vision.  Respiratory: Negative for cough and shortness of breath.   Cardiovascular: Negative for chest pain and leg swelling.  Gastrointestinal: Positive for constipation. Negative for nausea, vomiting and abdominal pain.  Genitourinary: Negative for dysuria.  Musculoskeletal: Positive for back pain and neck pain. Negative for falls.  Skin: Negative for rash.  Neurological: Negative for dizziness, loss of consciousness and headaches.  Psychiatric/Behavioral: Negative for depression.    Objective:  Physical Exam: Filed Vitals:   04/24/15 1527  BP: 128/79  Pulse: 73  Temp: 98.3 F (36.8 C)  TempSrc: Oral  Weight: 214 lb 6.4 oz (97.251 kg)  SpO2: 100%   Physical Exam  Constitutional: He is oriented to person, place, and time. He appears well-developed and well-nourished. No distress.  HENT:  Head: Normocephalic and atraumatic.  Eyes: EOM are normal.  Cardiovascular: Normal rate and regular rhythm.   Pulmonary/Chest: Effort normal and breath sounds normal.  Abdominal: Soft. There is no tenderness.  Genitourinary: Rectal exam shows fissure.  Scattered skin tags   Neurological: He is alert and oriented to person, place, and time.    Skin: Skin is warm and dry.  Psychiatric: He has a normal mood and affect.    Assessment & Plan:   Please see problem list for assessment and plan.  Case discussed with Dr. Oswaldo Done.

## 2015-04-27 NOTE — Progress Notes (Signed)
Internal Medicine Clinic Attending  I saw and evaluated the patient.  I personally confirmed the key portions of the history and exam documented by Dr. Wallace and I reviewed pertinent patient test results.  The assessment, diagnosis, and plan were formulated together and I agree with the documentation in the resident's note. 

## 2015-06-18 ENCOUNTER — Ambulatory Visit (INDEPENDENT_AMBULATORY_CARE_PROVIDER_SITE_OTHER): Payer: No Typology Code available for payment source | Admitting: Internal Medicine

## 2015-06-18 ENCOUNTER — Encounter: Payer: Self-pay | Admitting: Internal Medicine

## 2015-06-18 VITALS — BP 137/74 | HR 84 | Temp 98.6°F | Ht 66.0 in | Wt 223.3 lb

## 2015-06-18 DIAGNOSIS — R1011 Right upper quadrant pain: Secondary | ICD-10-CM | POA: Insufficient documentation

## 2015-06-18 MED ORDER — CYCLOBENZAPRINE HCL 10 MG PO TABS: 10.0000 mg | ORAL_TABLET | Freq: Every day | ORAL | Status: AC

## 2015-06-18 NOTE — Patient Instructions (Signed)
1. Please make a follow up appointment for 2 weeks.   2. Please take all medications as previously prescribed with the following changes:  Take flexeril 10 mg once at night for right-sided pain.   I will call you if there is anything significant or abnormal on your lab work.   If your symptoms do not resolve, we will pursue imaging (CT or ultrasound) at your next clinic visit.   3. If you have worsening of your symptoms or new symptoms arise (nausea, vomiting, fever, chills, diarrhea), please call the clinic ([phone]), or go to the ER immediately if symptoms are severe.

## 2015-06-18 NOTE — Progress Notes (Signed)
   Subjective:   Patient ID: Joe Pearson male   DOB: [birth year] 45 y.o.   MRN: [id number]  HPI: Joe Pearson is a 45 y.o. male w/ PMHx of cervical radiculopathy, presents to the clinic today for an acute visit for RUQ/right flank pain. The patient states this pain started acutely overnight, feels there is a "ball" there that feels like a cramp. Improves if he rubs it. No recent nausea, vomiting, abdominal pain, SOB, diarrhea, or blood in his stools. Denies any recent weight loss or night sweats. No fever, chills. States the pain is not associated with food. Says he is worried because his friend had chronic abdominal pain and was diagnosed with stage IV gastric CA.   No past medical history on file. Current Outpatient Prescriptions  Medication Sig Dispense Refill  . dextromethorphan-guaiFENesin (MUCINEX DM) 30-600 MG per 12 hr tablet Take 1 tablet by mouth 2 (two) times daily. Be sure to drink a large glass of water with each dose (Patient not taking: Reported on 02/25/2015) 28 tablet 0  . docusate sodium (COLACE) 100 MG capsule Take 1 capsule (100 mg total) by mouth daily as needed. 30 capsule 1  . guaiFENesin (MUCINEX) 600 MG 12 hr tablet Take 600 mg by mouth 2 (two) times daily as needed for cough or to loosen phlegm.     Marland Kitchen ibuprofen (ADVIL,MOTRIN) 200 MG tablet Take 200 mg by mouth every 6 (six) hours as needed for moderate pain.    Marland Kitchen lidocaine (XYLOCAINE) 2 % jelly Apply 1 application topically as needed. 30 mL 0  . Nitroglycerin 0.4 % OINT Place 1-2 drops rectally 2 (two) times daily. 1 Tube 0   No current facility-administered medications for this visit.    Review of Systems:  General: Denies fever, diaphoresis, appetite change, and fatigue.  Respiratory: Denies SOB, cough, and wheezing.   Cardiovascular: Denies chest pain and palpitations.  Gastrointestinal: Denies nausea, vomiting, abdominal pain, and diarrhea Musculoskeletal: Positive for RUQ/flank pain. Denies  arthralgias, back pain, and gait problem.  Neurological: Denies dizziness, syncope, weakness, lightheadedness, and headaches.  Psychiatric/Behavioral: Denies mood changes, sleep disturbance, and agitation.   Objective:   Physical Exam: Filed Vitals:   06/18/15 1434  BP: 137/74  Pulse: 84  Temp: 98.6 F (37 C)  TempSrc: Oral  Height:  (1.676 m)  Weight: 223 lb 4.8 oz (101.288 kg)  SpO2: 99%    General: White male, alert, cooperative, NAD. HEENT: PERRL, EOMI. Moist mucus membranes Neck: Full range of motion without pain, supple, no lymphadenopathy or carotid bruits Lungs: Clear to ascultation bilaterally, normal work of respiration, no wheezes, rales, rhonchi Heart: RRR, no murmurs, gallops, or rubs Abdomen: Soft, non-tender, non-distended, BS + Extremities: No cyanosis, clubbing, or edema Neurologic: Alert & oriented X3, cranial nerves II-XII intact, strength grossly intact, sensation intact to light touch   Assessment & Plan:   Please see problem based assessment and plan.

## 2015-06-19 LAB — CBC WITH DIFFERENTIAL/PLATELET
BASOS ABS: 0 10*3/uL (ref 0.0–0.2)
Basos: 0 %
EOS (ABSOLUTE): 0.3 10*3/uL (ref 0.0–0.4)
Eos: 4 %
Hematocrit: 41.8 % (ref 37.5–51.0)
Hemoglobin: 13.7 g/dL (ref 12.6–17.7)
IMMATURE GRANS (ABS): 0 10*3/uL (ref 0.0–0.1)
Immature Granulocytes: 0 %
Lymphocytes Absolute: 2.3 10*3/uL (ref 0.7–3.1)
Lymphs: 31 %
MCH: 28.1 pg (ref 26.6–33.0)
MCHC: 32.8 g/dL (ref 31.5–35.7)
MCV: 86 fL (ref 79–97)
Monocytes Absolute: 0.7 10*3/uL (ref 0.1–0.9)
Monocytes: 9 %
Neutrophils Absolute: 4.2 10*3/uL (ref 1.4–7.0)
Neutrophils: 56 %
Platelets: 218 10*3/uL (ref 150–379)
RBC: 4.88 x10E6/uL (ref 4.14–5.80)
RDW: 14 % (ref 12.3–15.4)
WBC: 7.5 10*3/uL (ref 3.4–10.8)

## 2015-06-19 LAB — CMP14 + ANION GAP
ALT: 23 IU/L (ref 0–44)
AST: 15 IU/L (ref 0–40)
Albumin/Globulin Ratio: 1.7 (ref 1.1–2.5)
Albumin: 4.3 g/dL (ref 3.5–5.5)
Alkaline Phosphatase: 65 IU/L (ref 39–117)
Anion Gap: 19 mmol/L — ABNORMAL HIGH (ref 10.0–18.0)
BUN/Creatinine Ratio: 13 (ref 9–20)
BUN: 19 mg/dL (ref 6–24)
Bilirubin Total: 0.4 mg/dL (ref 0.0–1.2)
CALCIUM: 9.3 mg/dL (ref 8.7–10.2)
CO2: 24 mmol/L (ref 18–29)
CREATININE: 1.5 mg/dL — AB (ref 0.76–1.27)
Chloride: 102 mmol/L (ref 96–106)
GFR calc Af Amer: 64 mL/min/{1.73_m2} (ref >59)
GFR, EST NON AFRICAN AMERICAN: 55 mL/min/{1.73_m2} — AB (ref >59)
GLUCOSE: 100 mg/dL — AB (ref 65–99)
Globulin, Total: 2.6 g/dL (ref 1.5–4.5)
POTASSIUM: 4.4 mmol/L (ref 3.5–5.2)
Sodium: 145 mmol/L — ABNORMAL HIGH (ref 134–144)
Total Protein: 6.9 g/dL (ref 6.0–8.5)

## 2015-06-19 LAB — LIPASE: Lipase: 26 U/L (ref 0–59)

## 2015-06-19 NOTE — Progress Notes (Signed)
Internal Medicine Clinic Attending  Case discussed with Dr. Jones at the time of the visit.  We reviewed the resident's history and exam and pertinent patient test results.  I agree with the assessment, diagnosis, and plan of care documented in the resident's note.  

## 2015-06-19 NOTE — Assessment & Plan Note (Addendum)
Patient describes a "knot" and associated pain in his RUQ/right flank that is a crampy pain that woke him up from his sleep. No associated symptoms. Seems to be describing a muscle spasm/cramp rather than a visceral pain associated with the biliary tract or liver. Do not think his complaints warrant a RUQ [place] at this point. Does not describe symptoms of biliary colic. No nausea, no change in appetite. CMP and CBC completely normal aside from a mildly elevated Cr and borderline elevated sodium, most likely because he is slightly dehydrated. Lipase normal.  -Given Rx for Flexeril to take qhs prn -If no improvement in symptoms, can then consider RUQ [place] or CT, but do not think this is necessary at this time.  -RTC in 2 weeks

## 2015-06-25 ENCOUNTER — Telehealth: Payer: Self-pay | Admitting: Internal Medicine

## 2015-06-25 DIAGNOSIS — A084 Viral intestinal infection, unspecified: Secondary | ICD-10-CM

## 2015-06-25 NOTE — Telephone Encounter (Signed)
Pt requesting lab result. Please call pt back. °

## 2015-06-26 MED ORDER — ONDANSETRON HCL 4 MG PO TABS: 4.0000 mg | ORAL_TABLET | ORAL | Status: AC | PRN

## 2015-06-26 NOTE — Telephone Encounter (Signed)
Called patient back with regards to his labs, no LFT abnormalities. Cr increased as well as sodium, most likely related to some dehydration. He did remark that he feels like he at something bad yesterday, had nausea, vomiting, and diarrhea overnight. Actually says his RUQ pain is much improved since his last visit.  -Will call in Zofran 4 mg q4h prn for nausea #30 to aide in increasing po intake -He will return to the clinic on 07/02/15   Lauris Chroman, MD PGY-3, Internal Medicine Pager: [phone]

## 2015-07-02 ENCOUNTER — Encounter: Payer: Self-pay | Admitting: Internal Medicine

## 2015-07-02 ENCOUNTER — Ambulatory Visit (INDEPENDENT_AMBULATORY_CARE_PROVIDER_SITE_OTHER): Payer: Self-pay | Admitting: Internal Medicine

## 2015-07-02 VITALS — BP 122/76 | HR 71 | Temp 98.2°F | Ht 66.0 in | Wt 218.8 lb

## 2015-07-02 DIAGNOSIS — Z09 Encounter for follow-up examination after completed treatment for conditions other than malignant neoplasm: Secondary | ICD-10-CM

## 2015-07-02 DIAGNOSIS — R1011 Right upper quadrant pain: Secondary | ICD-10-CM

## 2015-07-02 DIAGNOSIS — A084 Viral intestinal infection, unspecified: Secondary | ICD-10-CM

## 2015-07-02 DIAGNOSIS — Z8719 Personal history of other diseases of the digestive system: Secondary | ICD-10-CM

## 2015-07-02 NOTE — Progress Notes (Signed)
   Subjective:   Patient ID: Joe Pearson male   DOB: 1970-06-25 45 y.o.   MRN: [id number]  HPI: Joe Pearson is a 45 y.o. male w/ PMHx of cervical radiculopathy, presents to the clinic today for follow up visit for RUQ/right flank pain. Patient says the pain is improved. Not associated with food, feels like it is a muscle pain. Had viral gastroenteritis few days ago, symptoms resolved. No complaints today.   No past medical history on file.   Current Outpatient Prescriptions  Medication Sig Dispense Refill  . cyclobenzaprine (FLEXERIL) 10 MG tablet Take 1 tablet (10 mg total) by mouth at bedtime. 30 tablet 0  . dextromethorphan-guaiFENesin (MUCINEX DM) 30-600 MG per 12 hr tablet Take 1 tablet by mouth 2 (two) times daily. Be sure to drink a large glass of water with each dose (Patient not taking: Reported on 02/25/2015) 28 tablet 0  . docusate sodium (COLACE) 100 MG capsule Take 1 capsule (100 mg total) by mouth daily as needed. 30 capsule 1  . guaiFENesin (MUCINEX) 600 MG 12 hr tablet Take 600 mg by mouth 2 (two) times daily as needed for cough or to loosen phlegm.     Marland Kitchen ibuprofen (ADVIL,MOTRIN) 200 MG tablet Take 200 mg by mouth every 6 (six) hours as needed for moderate pain.    Marland Kitchen lidocaine (XYLOCAINE) 2 % jelly Apply 1 application topically as needed. 30 mL 0  . Nitroglycerin 0.4 % OINT Place 1-2 drops rectally 2 (two) times daily. 1 Tube 0  . ondansetron (ZOFRAN) 4 MG tablet Take 1 tablet (4 mg total) by mouth every 4 (four) hours as needed for nausea or vomiting. 30 tablet 0   No current facility-administered medications for this visit.    Review of Systems  General: Denies fever, diaphoresis, appetite change, and fatigue.  Respiratory: Denies SOB, cough, and wheezing.   Cardiovascular: Denies chest pain and palpitations.  Gastrointestinal: Denies nausea, vomiting, abdominal pain, and diarrhea Musculoskeletal: Denies myalgias, arthralgias, back pain, and gait problem.    Neurological: Denies dizziness, syncope, weakness, lightheadedness, and headaches.  Psychiatric/Behavioral: Denies mood changes, sleep disturbance, and agitation.    Objective:   Physical Exam: Filed Vitals:   07/02/15 1323  BP: 122/76  Pulse: 71  Temp: 98.2 F (36.8 C)  TempSrc: Oral  Height:  (1.676 m)  Weight: 218 lb 12.8 oz (99.247 kg)  SpO2: 99%    General: White male, alert, cooperative, NAD. HEENT: PERRL, EOMI. Moist mucus membranes Neck: Full range of motion without pain, supple, no lymphadenopathy or carotid bruits Lungs: Clear to ascultation bilaterally, normal work of respiration, no wheezes, rales, rhonchi Heart: RRR, no murmurs, gallops, or rubs Abdomen: Soft, non-tender, non-distended, BS + Extremities: No cyanosis, clubbing, or edema Neurologic: Alert & oriented X3, cranial nerves II-XII intact, strength grossly intact, sensation intact to light touch   Assessment & Plan:   Please see problem based assessment and plan.

## 2015-07-02 NOTE — Patient Instructions (Signed)
1. Please come back for follow up in 6 months unless your side pain comes back or gets worse.   2. Please take all medications as previously prescribed.  3. If you have worsening of your symptoms or new symptoms arise, please call the clinic ([phone]), or go to the ER immediately if symptoms are severe.

## 2015-07-03 NOTE — Assessment & Plan Note (Signed)
Only causes his mild discomfort when he lies on his right side. No pain with eating, non-tender on exam. Suspect this is MSK related pain. Does not seem like biliary colic.  -Patient to return in 6 months unless the pain returns.  -No indication for RUQ [place] at this time

## 2015-07-03 NOTE — Assessment & Plan Note (Signed)
Symptoms resolved 

## 2015-07-03 NOTE — Progress Notes (Signed)
Internal Medicine Clinic Attending  Case discussed with Dr. Jones at the time of the visit.  We reviewed the resident's history and exam and pertinent patient test results.  I agree with the assessment, diagnosis, and plan of care documented in the resident's note.  

## 2015-07-24 ENCOUNTER — Encounter: Payer: Self-pay | Admitting: Internal Medicine

## 2015-08-07 ENCOUNTER — Telehealth: Payer: Self-pay | Admitting: Internal Medicine

## 2015-08-07 NOTE — Telephone Encounter (Signed)
Requesting the nurse to call back. 

## 2015-08-07 NOTE — Telephone Encounter (Signed)
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

## 2015-08-08 NOTE — Telephone Encounter (Signed)
Thank you for getting him scheduled.

## 2015-08-12 ENCOUNTER — Encounter: Payer: Self-pay | Admitting: Internal Medicine

## 2015-08-12 ENCOUNTER — Ambulatory Visit (INDEPENDENT_AMBULATORY_CARE_PROVIDER_SITE_OTHER): Payer: Self-pay | Admitting: Internal Medicine

## 2015-08-12 VITALS — BP 135/87 | HR 63 | Temp 98.4°F | Ht 66.0 in | Wt 220.4 lb

## 2015-08-12 DIAGNOSIS — K0889 Other specified disorders of teeth and supporting structures: Secondary | ICD-10-CM

## 2015-08-12 DIAGNOSIS — K602 Anal fissure, unspecified: Secondary | ICD-10-CM

## 2015-08-12 MED ORDER — DILTIAZEM GEL 2 %: 1.0000 "application " | Freq: Two times a day (BID) | CUTANEOUS | Status: DC

## 2015-08-12 MED ORDER — LIDOCAINE HCL 2 % EX GEL: 1.0000 "application " | CUTANEOUS | Status: DC | PRN

## 2015-08-12 NOTE — Patient Instructions (Signed)
1. Please return as needed.   Someone will call you regarding surgery and dental clinic.   2. Please take all medications as previously prescribed with the following changes:  Use Diltiazem gel and lidocaine gel for fissure.   3. If you have worsening of your symptoms or new symptoms arise, please call the clinic [phone]), or go to the ER immediately if symptoms are severe.  Anal Fissure, Adult An anal fissure is a small tear or crack in the skin around the anus. Bleeding from a fissure usually stops on its own within a few minutes. However, bleeding will often occur again with each bowel movement until the crack heals. CAUSES This condition may be caused by:  Passing large, hard stool (feces).  Frequent diarrhea.  Constipation.  Inflammatory bowel disease (Crohn disease or ulcerative colitis).  Infections.  Anal sex. SYMPTOMS Symptoms of this condition include:  Bleeding from the rectum.  Small amounts of blood seen on your stool, on toilet paper, or in the toilet after a bowel movement.  Painful bowel movements.  Itching or irritation around the anus. DIAGNOSIS A health care provider may diagnose this condition by closely examining the anal area. An anal fissure can usually be seen with careful inspection. In some cases, a rectal exam may be performed, or a short tube (anoscope) may be used to examine the anal canal. TREATMENT Treatment for this condition may include:  Taking steps to avoid constipation. This may include making changes to your diet, such as increasing your intake of fiber or fluid.  Taking fiber supplements. These supplements can soften your stool to help make bowel movements easier. Your health care provider may also prescribe a stool softener if your stool is often hard.  Taking sitz baths. This may help to heal the tear.  Using medicated creams or ointments. These may be prescribed to lessen discomfort. HOME CARE INSTRUCTIONS Eating and  Drinking  Avoid foods that may be constipating, such as bananas and dairy products.  Drink enough fluid to keep your urine clear or pale yellow.  Maintain a diet that is high in fruits, whole grains, and vegetables. General Instructions  Keep the anal area as clean and dry as possible.  Take sitz baths as told by your health care provider. Do not use soap in the sitz baths.  Take over-the-counter and prescription medicines only as told by your health care provider.  Use creams or ointments only as told by your health care provider.  Keep all follow-up visits as told by your health care provider. This is important. SEEK MEDICAL CARE IF:  You have more bleeding.  You have a fever.  You have diarrhea that is mixed with blood.  You continue to have pain.  Your problem is getting worse rather than better.   This information is not intended to replace advice given to you by your health care provider. Make sure you discuss any questions you have with your health care provider.   Document Released: 05/02/2005 Document Revised: 01/21/2015 Document Reviewed: 07/28/2014 Elsevier Interactive Patient Education [employer]2016 [employer] Inc[employer]

## 2015-08-12 NOTE — Progress Notes (Signed)
   Subjective:   Patient ID: Joe Pearson male   DOB: Apr 27, 1971 45 y.o.   MRN: [id number]  HPI: [name] is a 45 y.o. 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 Per the patient, every time he uses the nitro, he feels like his head pounds, he gets a headache, and feels his heart beat. His previous prescription did not make him feel this way and it didn't look the same either. After calling the pharmacy, found out he had previously used Diltiazem gel instead. He says the fissure has been present for some time and has not gone away. He has been seen by surgery in the past and was instructed to return in medical management did not help.     No past medical history on file.   Current Outpatient Prescriptions  Medication Sig Dispense Refill  . cyclobenzaprine (FLEXERIL) 10 MG tablet Take 1 tablet (10 mg total) by mouth at bedtime. 30 tablet 0  . docusate sodium (COLACE) 100 MG capsule Take 1 capsule (100 mg total) by mouth daily as needed. 30 capsule 1  . ibuprofen (ADVIL,MOTRIN) 200 MG tablet Take 200 mg by mouth every 6 (six) hours as needed for moderate pain.    Marland Kitchen. lidocaine (XYLOCAINE) 2 % jelly Apply 1 application topically as needed. 30 mL 0  . Nitroglycerin 0.4 % OINT Place 1-2 drops rectally 2 (two) times daily. 1 Tube 0  . ondansetron (ZOFRAN) 4 MG tablet Take 1 tablet (4 mg total) by mouth every 4 (four) hours as needed for nausea or vomiting. 30 tablet 0   No current facility-administered medications for this visit.    Review of Systems  General: Denies fever, diaphoresis, appetite change, and fatigue.  Respiratory: Denies SOB, cough, and wheezing.   Cardiovascular: Denies chest pain and palpitations.  Gastrointestinal: Denies nausea, vomiting, abdominal pain, and diarrhea Musculoskeletal: Denies myalgias, arthralgias, back pain, and gait  problem.  Neurological: Denies dizziness, syncope, weakness, lightheadedness, and headaches.  Psychiatric/Behavioral: Denies mood changes, sleep disturbance, and agitation.    Objective:   Physical Exam: Filed Vitals:   08/12/15 0841  BP: 135/87  Pulse: 63  Temp: 98.4 F (36.9 C)  TempSrc: Oral  Height: 5\' 6"  (1.676 m)  Weight: 220 lb 6.4 oz (99.973 kg)  SpO2: 99%    General: White male, alert, cooperative, NAD. HEENT: PERRL, EOMI. Moist mucus membranes Neck: Full range of motion without pain, supple, no lymphadenopathy or carotid bruits Lungs: Clear to ascultation bilaterally, normal work of respiration, no wheezes, rales, rhonchi Heart: RRR, no murmurs, gallops, or rubs Abdomen: Soft, non-tender, non-distended, BS + Extremities: No cyanosis, clubbing, or edema Neurologic: Alert & oriented X3, cranial nerves II-XII intact, strength grossly intact, sensation intact to light touch   Assessment & Plan:   Please see problem based assessment and plan.

## 2015-08-13 NOTE — Assessment & Plan Note (Signed)
Recently given nitro paste for his fissure but he get a head pounding and headache when he uses this. Previously used Diltiazem gel per the pharmacy. 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  -Rx for diltiazem gel + lidocaine gel for pain -Stop nitro -Referral to surgery for possible surgical intervention

## 2015-08-13 NOTE — Progress Notes (Signed)
Internal Medicine Clinic Attending  Case discussed with Dr. Jones soon after the resident saw the patient.  We reviewed the resident's history and exam and pertinent patient test results.  I agree with the assessment, diagnosis, and plan of care documented in the resident's note. 

## 2015-08-13 NOTE — Assessment & Plan Note (Signed)
Patient with right tooth pain and bad taste in his mouth. On exam, patient has many dental caries, but no obvious pus or significant swelling.  -Referral to dentist clinic

## 2015-09-22 ENCOUNTER — Ambulatory Visit: Payer: Self-pay

## 2015-12-31 ENCOUNTER — Encounter: Payer: Self-pay | Admitting: Internal Medicine

## 2016-03-17 ENCOUNTER — Telehealth: Payer: Self-pay | Admitting: Internal Medicine

## 2016-03-17 NOTE — Telephone Encounter (Signed)
CALLED PT NEEDS TO RENEW GCCN CARD, NO ANSWER, NO VOICEMAIL

## 2016-03-23 ENCOUNTER — Ambulatory Visit: Payer: Self-pay

## 2017-11-11 ENCOUNTER — Encounter: Payer: Self-pay | Admitting: *Deleted

## 2017-12-19 ENCOUNTER — Emergency Department (HOSPITAL_COMMUNITY): Payer: Self-pay

## 2017-12-19 ENCOUNTER — Other Ambulatory Visit: Payer: Self-pay

## 2017-12-19 ENCOUNTER — Encounter (HOSPITAL_COMMUNITY): Payer: Self-pay | Admitting: Emergency Medicine

## 2017-12-19 ENCOUNTER — Emergency Department (HOSPITAL_COMMUNITY)
Admission: EM | Admit: 2017-12-19 | Discharge: 2017-12-19 | Disposition: A | Discharge status: Home / Self Care | Payer: Self-pay | Attending: Emergency Medicine | Admitting: Emergency Medicine

## 2017-12-19 DIAGNOSIS — R1084 Generalized abdominal pain: Secondary | ICD-10-CM | POA: Insufficient documentation

## 2017-12-19 DIAGNOSIS — R1032 Left lower quadrant pain: Secondary | ICD-10-CM | POA: Insufficient documentation

## 2017-12-19 DIAGNOSIS — R109 Unspecified abdominal pain: Secondary | ICD-10-CM

## 2017-12-19 LAB — CBC
HEMATOCRIT: 45.7 % (ref 39.0–52.0)
Hemoglobin: 14.2 g/dL (ref 13.0–17.0)
MCH: 27.8 pg (ref 26.0–34.0)
MCHC: 31.1 g/dL (ref 30.0–36.0)
MCV: 89.4 fL (ref 78.0–100.0)
Platelets: 198 10*3/uL (ref 150–400)
RBC: 5.11 MIL/uL (ref 4.22–5.81)
RDW: 13.9 % (ref 11.5–15.5)
WBC: 7.1 10*3/uL (ref 4.0–10.5)

## 2017-12-19 LAB — URINALYSIS, ROUTINE W REFLEX MICROSCOPIC
Bilirubin Urine: NEGATIVE
GLUCOSE, UA: NEGATIVE mg/dL
Hgb urine dipstick: NEGATIVE
Ketones, ur: NEGATIVE mg/dL
Leukocytes, UA: NEGATIVE
Nitrite: NEGATIVE
PH: 6 (ref 5.0–8.0)
Protein, ur: NEGATIVE mg/dL
SPECIFIC GRAVITY, URINE: 1.018 (ref 1.005–1.030)

## 2017-12-19 LAB — COMPREHENSIVE METABOLIC PANEL
ALT: 35 U/L (ref 0–44)
AST: 21 U/L (ref 15–41)
Albumin: 4 g/dL (ref 3.5–5.0)
Alkaline Phosphatase: 52 U/L (ref 38–126)
Anion gap: 7 (ref 5–15)
BILIRUBIN TOTAL: 0.8 mg/dL (ref 0.3–1.2)
BUN: 20 mg/dL (ref 6–20)
CO2: 28 mmol/L (ref 22–32)
Calcium: 9.5 mg/dL (ref 8.9–10.3)
Chloride: 108 mmol/L (ref 98–111)
Creatinine, Ser: 1.35 mg/dL — ABNORMAL HIGH (ref 0.61–1.24)
GFR calc non Af Amer: >60 mL/min (ref >60)
Glucose, Bld: 96 mg/dL (ref 70–99)
Potassium: 4.2 mmol/L (ref 3.5–5.1)
Sodium: 143 mmol/L (ref 135–145)
Total Protein: 6.3 g/dL — ABNORMAL LOW (ref 6.5–8.1)

## 2017-12-19 LAB — LIPASE, BLOOD: LIPASE: 35 U/L (ref 11–51)

## 2017-12-19 MED ORDER — HYDROMORPHONE HCL 1 MG/ML IJ SOLN
0.5000 mg | Freq: Once | INTRAMUSCULAR | Status: AC
  Administered 2017-12-19: 0.5 mg via INTRAVENOUS
  Filled 2017-12-19: qty 1

## 2017-12-19 MED ORDER — KETOROLAC TROMETHAMINE 15 MG/ML IJ SOLN
15.0000 mg | Freq: Once | INTRAMUSCULAR | Status: AC
  Administered 2017-12-19: 15 mg via INTRAVENOUS
  Filled 2017-12-19: qty 1

## 2017-12-19 MED ORDER — METHOCARBAMOL 500 MG PO TABS: 1000.0000 mg | ORAL_TABLET | Freq: Four times a day (QID) | ORAL | 0 refills | Status: DC | PRN

## 2017-12-19 MED ORDER — HYDROMORPHONE HCL 1 MG/ML IJ SOLN: 0.5000 mg | Freq: Once | INTRAMUSCULAR | Status: DC

## 2017-12-19 MED ORDER — METHOCARBAMOL 500 MG PO TABS: 1000.0000 mg | ORAL_TABLET | Freq: Once | ORAL | Status: DC

## 2017-12-19 MED ORDER — NAPROXEN 500 MG PO TABS: 500.0000 mg | ORAL_TABLET | Freq: Two times a day (BID) | ORAL | 0 refills | Status: DC

## 2017-12-19 NOTE — ED Provider Notes (Signed)
[facility] Provider Note MRN:  [id number]  Arrival date & time: 12/19/17     Chief Complaint   Back Pain and Abdominal Pain   History of Present Illness   Joe PasturesRobert M Pearson is a 47 y.o. year-old male with no pertinent past medical history presenting to the ED with chief complaint of flank pain.  Pain is located in the left flank.  The pain is been present for 3 days, gradual onset has been getting progressively worse.  Patient unable to achieve a comfortable position when lying down.  Denies hematuria, no dysuria.  No recent fevers or chills, no nausea vomiting, no chest pain or shortness of breath.  The pain is moderate in severity, at times radiates to the left lower quadrant of the abdomen.  Review of Systems  A complete 10 system review of systems was obtained and all systems are negative except as noted in the HPI and PMH.   Patient's Health History   History reviewed. No pertinent past medical history.  Past Surgical History:  Procedure Laterality Date  . APPENDECTOMY    . 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 Cancer Mother        breast  . Sudden death Neg Hx   . Hyperlipidemia Neg Hx   . Hypertension Neg Hx   . Heart attack Neg Hx   . Diabetes Neg Hx     Social History   Socioeconomic History  . Marital status: Single    Spouse name: Not on file  . Number of children: Not on file  . Years of education: Not on file  . Highest education level: Not on file  Occupational History  . Not on file  Social Needs  . Financial resource strain: Not on file  . Food insecurity:    Worry: Not on file    Inability: Not on file  . Transportation needs:    Medical: Not on file    Non-medical: Not on file  Tobacco Use  . Smoking status: Never Smoker  . Smokeless tobacco: Never Used  Substance and Sexual Activity  . Alcohol use: No    Alcohol/week: 0.0 oz  . Drug use: No    . Sexual activity: Not on file  Lifestyle  . Physical activity:    Days per week: Not on file    Minutes per session: Not on file  . Stress: Not on file  Relationships  . Social connections:    Talks on phone: Not on file    Gets together: Not on file    Attends religious service: Not on file    Active member of club or organization: Not on file    Attends meetings of clubs or organizations: Not on file    Relationship status: Not on file  . Intimate partner violence:    Fear of current or ex partner: Not on file    Emotionally abused: Not on file    Physically abused: Not on file    Forced sexual activity: Not on file  Other Topics Concern  . Not on file  Social History Narrative  . Not on file     Physical Exam  Vital Signs and Nursing Notes reviewed Vitals:   12/19/17 1615 12/19/17 1700  BP: 113/75 104/80  Pulse: (!) 57 (!) 58  Resp: 12 17  Temp:    SpO2: 98% 98%  CONSTITUTIONAL: Well-appearing, NAD NEURO:  Alert and oriented x 3, no focal deficits EYES:  eyes equal and reactive ENT/NECK:  no LAD, no JVD CARDIO: Regular rate, well-perfused, normal S1 and S2 PULM:  CTAB no wheezing or rhonchi GI/GU:  normal bowel sounds, non-distended, non-tender MSK/SPINE:  No gross deformities, no edema SKIN:  no rash, atraumatic PSYCH:  Appropriate speech and behavior  Diagnostic and Interventional Summary    EKG Interpretation  Date/Time:    Ventricular Rate:    PR Interval:    QRS Duration:   QT Interval:    QTC Calculation:   R Axis:     Text Interpretation:        Labs Reviewed  COMPREHENSIVE METABOLIC PANEL - Abnormal; Notable for the following components:      Result Value   Creatinine, Ser 1.35 (*)    Total Protein 6.3 (*)    All other components within normal limits  CBC  LIPASE, BLOOD  URINALYSIS, ROUTINE W REFLEX MICROSCOPIC    CT Renal Stone Study  Final Result      Medications  ketorolac (TORADOL) 15 MG/ML injection 15 mg (15 mg  Intravenous Given 12/19/17 1548)  HYDROmorphone (DILAUDID) injection 0.5 mg (0.5 mg Intravenous Given 12/19/17 1550)     Procedures EMERGENCY DEPARTMENT ULTRASOUND  Study: Limited Retroperitoneal Ultrasound of the Abdominal Aorta.  INDICATIONS:Abdominal pain and Back pain Multiple views of the abdominal aorta were obtained in real-time from the diaphragmatic hiatus to the aortic bifurcation in transverse planes with a multi-frequency probe.  PERFORMED BY: Myself IMAGES ARCHIVED?: Yes LIMITATIONS:  None INTERPRETATION:  No abdominal aortic aneurysm  EMERGENCY DEPARTMENT US RENAL EXAM  "Study: Limited Retroperitoneal Ultrasound of Kidneys"  INDICATIONS: Flank pain Long and short axis of both kidneys were obtained.   PERFORMED BY: Myself IMAGES ARCHIVED?: Yes LIMITATIONS: None VIEWS USED: Long axis and Short axis  INTERPRETATION: Possible scant left hydronephrosis  Critical Care  ED Course and Medical Decision Making  I have reviewed the triage vital signs and the nursing notes.  Pertinent labs & imaging results that were available during my care of the patient were reviewed by me and considered in my medical decision making (see below for details).    47 year old male otherwise healthy presenting with acute flank pain.  Considering kidney stone versus MSK.  Bedside ultrasound reveals no aortic abnormalities.  Given the diagnostic uncertainty CT performed and is unremarkable.  Patient then recalls lifting heavy objects few days ago, thinks he might have a muscle strain.  This now seems highly likely, given prescription for muscle relaxers.  After the discussed management above, the patient was determined to be safe for discharge.  The patient was in agreement with this plan and all questions regarding their care were answered.  ED return precautions were discussed and the patient will return to the ED with any significant worsening of condition.  Elmer Sow. Pilar Plate, MD [facility]  Emergency Medicine [facility] [email or web address]  Final Clinical Impressions(s) / ED Diagnoses     ICD-10-CM   1. Flank pain R10.9     ED Discharge Orders        Ordered    methocarbamol (ROBAXIN) 500 MG tablet  4 times daily PRN     12/19/17 1742    naproxen (NAPROSYN) 500 MG tablet  2 times daily     12/19/17 1742         Sabas Sous, MD 12/19/17 2249

## 2017-12-19 NOTE — ED Triage Notes (Signed)
Pt. Stated, I started having back back lower to the middle, and now its pulsated into my stomach.

## 2017-12-19 NOTE — Discharge Instructions (Addendum)
You were evaluated in the Emergency Department and after careful evaluation, we did not find any emergent condition requiring admission or further testing in the hospital.  Your symptoms today seem to be due to muscle strain or spasm.  Please take the medications provided as needed for pain and follow-up with your regular doctor.  Please return to the Emergency Department if you experience any worsening of your condition.  We encourage you to follow up with a primary care provider.  Thank you for allowing us to be a part of your care.

## 2017-12-19 NOTE — ED Notes (Signed)
ED Provider at bedside. 

## 2017-12-19 NOTE — ED Notes (Signed)
Patient transported to CT 

## 2017-12-21 ENCOUNTER — Other Ambulatory Visit: Payer: Self-pay

## 2017-12-21 ENCOUNTER — Emergency Department (HOSPITAL_COMMUNITY)
Admission: EM | Admit: 2017-12-21 | Discharge: 2017-12-21 | Disposition: A | Discharge status: Home / Self Care | Payer: Self-pay | Attending: Emergency Medicine | Admitting: Emergency Medicine

## 2017-12-21 ENCOUNTER — Encounter (HOSPITAL_COMMUNITY): Payer: Self-pay | Admitting: Emergency Medicine

## 2017-12-21 DIAGNOSIS — R109 Unspecified abdominal pain: Secondary | ICD-10-CM | POA: Insufficient documentation

## 2017-12-21 DIAGNOSIS — Z5321 Procedure and treatment not carried out due to patient leaving prior to being seen by health care provider: Secondary | ICD-10-CM | POA: Insufficient documentation

## 2017-12-21 LAB — URINALYSIS, ROUTINE W REFLEX MICROSCOPIC
Bilirubin Urine: NEGATIVE
GLUCOSE, UA: NEGATIVE mg/dL
Hgb urine dipstick: NEGATIVE
KETONES UR: NEGATIVE mg/dL
LEUKOCYTES UA: NEGATIVE
Nitrite: NEGATIVE
Protein, ur: NEGATIVE mg/dL
Specific Gravity, Urine: 1.021 (ref 1.005–1.030)
pH: 5 (ref 5.0–8.0)

## 2017-12-21 LAB — CBC
HCT: 43.9 % (ref 39.0–52.0)
Hemoglobin: 13.8 g/dL (ref 13.0–17.0)
MCH: 28.4 pg (ref 26.0–34.0)
MCHC: 31.4 g/dL (ref 30.0–36.0)
MCV: 90.3 fL (ref 78.0–100.0)
PLATELETS: 197 10*3/uL (ref 150–400)
RBC: 4.86 MIL/uL (ref 4.22–5.81)
RDW: 13.8 % (ref 11.5–15.5)
WBC: 7 10*3/uL (ref 4.0–10.5)

## 2017-12-21 LAB — COMPREHENSIVE METABOLIC PANEL
ALK PHOS: 55 U/L (ref 38–126)
ALT: 37 U/L (ref 0–44)
AST: 20 U/L (ref 15–41)
Albumin: 4.1 g/dL (ref 3.5–5.0)
Anion gap: 8 (ref 5–15)
BILIRUBIN TOTAL: 0.9 mg/dL (ref 0.3–1.2)
BUN: 26 mg/dL — AB (ref 6–20)
CO2: 27 mmol/L (ref 22–32)
CREATININE: 1.36 mg/dL — AB (ref 0.61–1.24)
Calcium: 9.1 mg/dL (ref 8.9–10.3)
Chloride: 104 mmol/L (ref 98–111)
GFR, EST NON AFRICAN AMERICAN: 60 mL/min — AB (ref >60)
Glucose, Bld: 100 mg/dL — ABNORMAL HIGH (ref 70–99)
Potassium: 4.1 mmol/L (ref 3.5–5.1)
Sodium: 139 mmol/L (ref 135–145)
TOTAL PROTEIN: 6.6 g/dL (ref 6.5–8.1)

## 2017-12-21 LAB — LIPASE, BLOOD: LIPASE: 35 U/L (ref 11–51)

## 2017-12-21 MED ORDER — OXYCODONE-ACETAMINOPHEN 5-325 MG PO TABS
1.0000 | ORAL_TABLET | Freq: Once | ORAL | Status: DC
  Filled 2017-12-21: qty 1

## 2017-12-21 NOTE — ED Triage Notes (Signed)
Pt reports being seen for back pain. Pt reports no relief. Pt reports he is now having abdominal pain, some possible swelling to L side, pt has obese stomach. Pt denies N/V/D.

## 2017-12-21 NOTE — ED Notes (Signed)
RN attempted to administer pain medication. Pt asked about the wait. Pt informed of the longest wait time and informed we cannot give him an exact time of when he will be seen. Pt states, "I can not wait here all night, I'm uncomfortable. I just want to go home to sleep." Pt encouraged to stay. Pt does not want to stay. Pain medication not administered.

## 2017-12-29 ENCOUNTER — Emergency Department (HOSPITAL_COMMUNITY)
Admission: EM | Admit: 2017-12-29 | Discharge: 2017-12-30 | Disposition: A | Discharge status: Home / Self Care | Payer: Self-pay | Attending: Emergency Medicine | Admitting: Emergency Medicine

## 2017-12-29 ENCOUNTER — Other Ambulatory Visit: Payer: Self-pay

## 2017-12-29 ENCOUNTER — Emergency Department (HOSPITAL_COMMUNITY): Payer: Self-pay

## 2017-12-29 DIAGNOSIS — R1032 Left lower quadrant pain: Secondary | ICD-10-CM | POA: Insufficient documentation

## 2017-12-29 DIAGNOSIS — T508X5A Adverse effect of diagnostic agents, initial encounter: Secondary | ICD-10-CM | POA: Insufficient documentation

## 2017-12-29 DIAGNOSIS — L270 Generalized skin eruption due to drugs and medicaments taken internally: Secondary | ICD-10-CM | POA: Insufficient documentation

## 2017-12-29 DIAGNOSIS — T50995A Adverse effect of other drugs, medicaments and biological substances, initial encounter: Secondary | ICD-10-CM

## 2017-12-29 LAB — CBC
HCT: 43.3 % (ref 39.0–52.0)
Hemoglobin: 13.8 g/dL (ref 13.0–17.0)
MCH: 28.3 pg (ref 26.0–34.0)
MCHC: 31.9 g/dL (ref 30.0–36.0)
MCV: 88.7 fL (ref 78.0–100.0)
Platelets: 193 10*3/uL (ref 150–400)
RBC: 4.88 MIL/uL (ref 4.22–5.81)
RDW: 14 % (ref 11.5–15.5)
WBC: 7 10*3/uL (ref 4.0–10.5)

## 2017-12-29 LAB — COMPREHENSIVE METABOLIC PANEL
ALT: 30 U/L (ref 0–44)
AST: 18 U/L (ref 15–41)
Albumin: 4 g/dL (ref 3.5–5.0)
Alkaline Phosphatase: 56 U/L (ref 38–126)
Anion gap: 9 (ref 5–15)
BILIRUBIN TOTAL: 0.6 mg/dL (ref 0.3–1.2)
BUN: 23 mg/dL — AB (ref 6–20)
CALCIUM: 9.4 mg/dL (ref 8.9–10.3)
CO2: 24 mmol/L (ref 22–32)
Chloride: 107 mmol/L (ref 98–111)
Creatinine, Ser: 1.4 mg/dL — ABNORMAL HIGH (ref 0.61–1.24)
GFR calc Af Amer: >60 mL/min (ref >60)
GFR, EST NON AFRICAN AMERICAN: 58 mL/min — AB (ref >60)
Glucose, Bld: 105 mg/dL — ABNORMAL HIGH (ref 70–99)
POTASSIUM: 3.9 mmol/L (ref 3.5–5.1)
Sodium: 140 mmol/L (ref 135–145)
TOTAL PROTEIN: 6.4 g/dL — AB (ref 6.5–8.1)

## 2017-12-29 LAB — I-STAT TROPONIN, ED: Troponin i, poc: 0.01 ng/mL (ref 0.00–0.08)

## 2017-12-29 LAB — URINALYSIS, ROUTINE W REFLEX MICROSCOPIC
BILIRUBIN URINE: NEGATIVE
GLUCOSE, UA: NEGATIVE mg/dL
Hgb urine dipstick: NEGATIVE
KETONES UR: NEGATIVE mg/dL
LEUKOCYTES UA: NEGATIVE
Nitrite: NEGATIVE
PH: 5 (ref 5.0–8.0)
Protein, ur: NEGATIVE mg/dL
Specific Gravity, Urine: 1.021 (ref 1.005–1.030)

## 2017-12-29 LAB — LIPASE, BLOOD: Lipase: 35 U/L (ref 11–51)

## 2017-12-29 MED ORDER — IBUPROFEN 400 MG PO TABS
400.0000 mg | ORAL_TABLET | Freq: Once | ORAL | Status: AC | PRN
  Administered 2017-12-29: 400 mg via ORAL
  Filled 2017-12-29: qty 1

## 2017-12-29 NOTE — ED Notes (Addendum)
While this tech was obtaining his vital signs, Pt angrily states that "there's nothing wrong with my heart" and that he's been here several times in the past week and "keeps being told I have a pulled muscle, but this isn't a pulled muscle." This tech attempted to explain the importance of vital signs but Pt was dismissive.

## 2017-12-29 NOTE — ED Triage Notes (Signed)
Pt reports LUQ abdominal pain radiating to L chest. Pt also complaining of lower back pain. Pt reports feeling light-headed, states he started feeling sweaty earlier today.

## 2017-12-29 NOTE — ED Notes (Signed)
Pt requesting pain medication for CP

## 2017-12-30 ENCOUNTER — Emergency Department (HOSPITAL_COMMUNITY): Payer: Self-pay

## 2017-12-30 MED ORDER — METHYLPREDNISOLONE SODIUM SUCC 125 MG IJ SOLR
INTRAMUSCULAR | Status: AC
Start: 2017-12-30 — End: 2017-12-30
  Administered 2017-12-30: 03:00:00
  Filled 2017-12-30: qty 2

## 2017-12-30 MED ORDER — MORPHINE SULFATE (PF) 4 MG/ML IV SOLN
6.0000 mg | Freq: Once | INTRAVENOUS | Status: AC
  Administered 2017-12-30: 6 mg via INTRAVENOUS
  Filled 2017-12-30: qty 2

## 2017-12-30 MED ORDER — DIPHENHYDRAMINE HCL 50 MG/ML IJ SOLN
INTRAMUSCULAR | Status: AC
Start: 2017-12-30 — End: 2017-12-30
  Administered 2017-12-30: 03:00:00
  Filled 2017-12-30: qty 1

## 2017-12-30 MED ORDER — IOPAMIDOL (ISOVUE-300) INJECTION 61%
100.0000 mL | Freq: Once | INTRAVENOUS | Status: AC
  Administered 2017-12-30: 100 mL via INTRAVENOUS

## 2017-12-30 MED ORDER — DIPHENHYDRAMINE HCL 50 MG/ML IJ SOLN
50.0000 mg | Freq: Once | INTRAMUSCULAR | Status: AC
  Administered 2017-12-30: 50 mg via INTRAVENOUS

## 2017-12-30 MED ORDER — FAMOTIDINE IN NACL 20-0.9 MG/50ML-% IV SOLN
20.0000 mg | Freq: Once | INTRAVENOUS | Status: AC
  Administered 2017-12-30: 20 mg via INTRAVENOUS
  Filled 2017-12-30: qty 50

## 2017-12-30 MED ORDER — METHYLPREDNISOLONE SODIUM SUCC 125 MG IJ SOLR
125.0000 mg | Freq: Once | INTRAMUSCULAR | Status: AC
  Administered 2017-12-30: 125 mg via INTRAVENOUS

## 2017-12-30 NOTE — ED Provider Notes (Signed)
[facility] Provider Note   CSN: [id number] Arrival date & time: 12/29/17  1812     History   Chief Complaint Chief Complaint  Patient presents with  . Abdominal Pain  . Chest Pain    HPI Joe Pearson is a 47 y.o. male.  HPI Patient is a 47 year old male who reports ongoing left-sided abdominal pain over the past 12 to 14 days.  His pain is constant.  Its worsening.  He was seen in the emergency department on December 19, 2017 at that time had a CT stone study which demonstrated no intra-abdominal pathology or hydronephrosis.  Patient reports pain is been ongoing and worsening since then.  Denies diarrhea.  No blood in his stool.  Denies nausea vomiting.  No fevers or chills.  Pain is located in the left upper quadrant left side of his abdomen.  Some of his pain radiates in the left chest.  No difficulty breathing or shortness of breath.  He feels slightly lightheaded.  He feels as though he broke into a cold sweat earlier tonight while driving his car.  He reports mild low back pain left greater than right.  No weakness of his arms or legs.  No injury or trauma.  No fevers.  Symptoms are moderate in severity.   No past medical history on file.  Patient Active Problem List   Diagnosis Date Noted  . Tooth pain 08/12/2015  . Viral gastroenteritis 06/26/2015  . RUQ pain 06/18/2015  . Anal fissure 04/24/2015  . Health care maintenance 02/25/2015  . Viral upper respiratory illness 02/25/2015  . Cervical radiculopathy 07/21/2011    Past Surgical History:  Procedure Laterality Date  . APPENDECTOMY    . TUMOR REMOVAL Left    left leg tumor removal        Home Medications    Prior to Admission medications   Medication Sig Start Date End Date Taking? Authorizing Provider  diltiazem 2 % GEL Apply 1 application topically 2 (two) times daily. Apply to fissure Patient not taking: Reported on 12/29/2017 08/12/15   Courtney ParisJones, Eden W, MD    lidocaine (XYLOCAINE) 2 % jelly Apply 1 application topically as needed. Patient not taking: Reported on 12/29/2017 08/12/15   Courtney ParisJones, Eden W, MD  methocarbamol (ROBAXIN) 500 MG tablet Take 2 tablets (1,000 mg total) by mouth 4 (four) times daily as needed for muscle spasms. Patient not taking: Reported on 12/29/2017 12/19/17   Sabas SousBero, Michael M, MD  naproxen (NAPROSYN) 500 MG tablet Take 1 tablet (500 mg total) by mouth 2 (two) times daily. Patient not taking: Reported on 12/29/2017 12/19/17   Sabas SousBero, Michael M, MD    Family History Family History  Problem Relation Age of Onset  . Cancer Mother        breast  . Sudden death Neg Hx   . Hyperlipidemia Neg Hx   . Hypertension Neg Hx   . Heart attack Neg Hx   . Diabetes Neg Hx     Social History Social History   Tobacco Use  . Smoking status: Never Smoker  . Smokeless tobacco: Never Used  Substance Use Topics  . Alcohol use: No    Alcohol/week: 0.0 standard drinks  . Drug use: No     Allergies   Contrast media [iodinated diagnostic agents]   Review of Systems Review of Systems  All other systems reviewed and are negative.    Physical Exam Updated Vital Signs BP 116/81   Pulse Marland Kitchen(!)  58   Temp 98.7 F (37.1 C) (Oral)   Resp 14   Ht 5\' 6"  (1.676 m)   Wt 115.7 kg   SpO2 95%   BMI 41.16 kg/m   Physical Exam  Constitutional: He is oriented to person, place, and time. He appears well-developed and well-nourished.  HENT:  Head: Normocephalic and atraumatic.  Eyes: EOM are normal.  Neck: Normal range of motion.  Cardiovascular: Normal rate, regular rhythm, normal heart sounds and intact distal pulses.  Pulmonary/Chest: Effort normal and breath sounds normal. No respiratory distress.  Abdominal: Soft. He exhibits no distension.  Mild generalized left-sided abdominal tenderness without guarding or rebound.  Overlying skin is normal.  Musculoskeletal: Normal range of motion.  Neurological: He is alert and oriented to person,  place, and time.  Skin: Skin is warm and dry.  Psychiatric: He has a normal mood and affect. Judgment normal.  Nursing note and vitals reviewed.    ED Treatments / Results  Labs (all labs ordered are listed, but only abnormal results are displayed) Labs Reviewed  COMPREHENSIVE METABOLIC PANEL - Abnormal; Notable for the following components:      Result Value   Glucose, Bld 105 (*)    BUN 23 (*)    Creatinine, Ser 1.40 (*)    Total Protein 6.4 (*)    GFR calc non Af Amer 58 (*)    All other components within normal limits  CBC  LIPASE, BLOOD  URINALYSIS, ROUTINE W REFLEX MICROSCOPIC  I-STAT TROPONIN, ED    EKG EKG Interpretation  Date/Time:  Friday December 29 2017 18:48:18 EDT Ventricular Rate:  86 PR Interval:  152 QRS Duration: 98 QT Interval:  380 QTC Calculation: 454 R Axis:   88 Text Interpretation:  Normal sinus rhythm with sinus arrhythmia Normal ECG No significant change was found Confirmed by Azalia Bilis ([id number]) on 12/30/2017 12:53:40 AM   Radiology Dg Chest 2 View  Result Date: 12/29/2017 CLINICAL DATA:  Left upper quadrant abdominal pain radiating to the left chest. Low back pain. EXAM: CHEST - 2 VIEW COMPARISON:  Radiographs 07/22/2015.  Abdominal CT 12/19/2017. FINDINGS: The heart size and mediastinal contours are normal. The lungs are clear. There is no pleural effusion or pneumothorax. No acute osseous findings are identified. IMPRESSION: No active cardiopulmonary process. Electronically Signed   By: Carey Bullocks M.D.   On: 12/29/2017 19:16   Ct Abdomen Pelvis W Contrast  Result Date: 12/30/2017 CLINICAL DATA:  Low back pain radiating to the abdomen for 3 days. Left lower quadrant pain. History of appendectomy. EXAM: CT ABDOMEN AND PELVIS WITH CONTRAST TECHNIQUE: Multidetector CT imaging of the abdomen and pelvis was performed using the standard protocol following bolus administration of intravenous contrast. CONTRAST:  ISOVUE-300 IOPAMIDOL  (ISOVUE-300) INJECTION 61% COMPARISON:  12/19/2017 FINDINGS: Lower chest: Lung bases are clear. Hepatobiliary: Diffuse fatty infiltration of the liver. No focal liver abnormality is seen. No gallstones, gallbladder wall thickening, or biliary dilatation. Pancreas: Unremarkable. No pancreatic ductal dilatation or surrounding inflammatory changes. Spleen: Normal in size without focal abnormality. Adrenals/Urinary Tract: Adrenal glands are unremarkable. Kidneys are normal, without renal calculi, focal lesion, or hydronephrosis. Bladder is unremarkable. Stomach/Bowel: Stomach, small bowel, and colon are not abnormally distended. No wall thickening or infiltration is identified. Sigmoid colonic diverticula without evidence of diverticulitis. Appendix is surgically absent. Vascular/Lymphatic: No significant vascular findings are present. No enlarged abdominal or pelvic lymph nodes. Reproductive: Prostate is unremarkable. Other: No abdominal wall hernia or abnormality. No abdominopelvic ascites. Musculoskeletal:  7 cm septated cystic lesion in the proximal right femur with central nidus. Appearance is unchanged since previous study. No expansile changes or periosteal reaction. Mild degenerative changes in the spine. IMPRESSION: No acute process demonstrated in the abdomen or pelvis. No evidence of bowel obstruction or inflammation. Diffuse fatty infiltration of the liver. Benign-appearing lucent bone lesion in the right proximal femur without change since prior study. Electronically Signed   By: Burman NievesWilliam  Stevens M.D.   On: 12/30/2017 02:39    Procedures Procedures (including critical care time)  Medications Ordered in ED Medications  ibuprofen (ADVIL,MOTRIN) tablet 400 mg (400 mg Oral Given 12/29/17 2142)  morphine 4 MG/ML injection 6 mg (6 mg Intravenous Given 12/30/17 0151)  iopamidol (ISOVUE-300) 61 % injection 100 mL (100 mLs Intravenous Contrast Given 12/30/17 0205)  diphenhydrAMINE (BENADRYL) injection 50 mg  (50 mg Intravenous Given 12/30/17 0222)  famotidine (PEPCID) IVPB 20 mg premix (0 mg Intravenous Stopped 12/30/17 0310)  methylPREDNISolone sodium succinate (SOLU-MEDROL) 125 mg/2 mL injection 125 mg (125 mg Intravenous Given 12/30/17 0225)  diphenhydrAMINE (BENADRYL) 50 MG/ML injection (  Given 12/30/17 0259)  methylPREDNISolone sodium succinate (SOLU-MEDROL) 125 mg/2 mL injection (  Given 12/30/17 0259)     Initial Impression / Assessment and Plan / ED Course  I have reviewed the triage vital signs and the nursing notes.  Pertinent labs & imaging results that were available during my care of the patient were reviewed by me and considered in my medical decision making (see chart for details).     Repeat imaging demonstrates no intra-abdominal pathology found on CT imaging.  He will need outpatient GI follow-up and possible colonoscopy plus or minus endoscopy.  No vomiting in the emergency department.  No peritoneal signs.  Overall well-appearing.  He did develop hives across his face abdomen back and legs after receiving IV dye for his CT abdomen pelvis.  This the first time in our record that we have him receiving IV dye.  He had no anaphylaxis.  He had no difficulty breathing or swallowing.  He responded to Benadryl, Pepcid, Solu-Medrol.  IV dye has been placed onto his allergy list.  Patient has been informed of this allergy as well.  Final Clinical Impressions(s) / ED Diagnoses   Final diagnoses:  Left lower quadrant pain  Allergy to IVP dye, initial encounter    ED Discharge Orders    None       Azalia Bilisampos, Witt Plitt, MD 12/30/17 [phone]

## 2017-12-30 NOTE — ED Notes (Signed)
Family at bedside. 

## 2017-12-30 NOTE — ED Notes (Signed)
ED Provider at bedside. 

## 2017-12-30 NOTE — ED Notes (Signed)
Patient transported to CT 

## 2017-12-30 NOTE — ED Notes (Signed)
Pt verbalized dc instructions, vss, ambulatory upon discharge. Stated he was calling a friend for a ride.

## 2017-12-30 NOTE — Progress Notes (Signed)
Pt had itching and hives after contrast administration.  Joe BilisKevin Campos MD notified and assessed patient.

## 2018-01-02 ENCOUNTER — Encounter: Payer: Self-pay | Admitting: Internal Medicine

## 2018-01-02 ENCOUNTER — Ambulatory Visit (INDEPENDENT_AMBULATORY_CARE_PROVIDER_SITE_OTHER): Payer: Self-pay | Admitting: Internal Medicine

## 2018-01-02 ENCOUNTER — Other Ambulatory Visit: Payer: Self-pay

## 2018-01-02 DIAGNOSIS — M5412 Radiculopathy, cervical region: Secondary | ICD-10-CM

## 2018-01-02 DIAGNOSIS — M545 Low back pain, unspecified: Secondary | ICD-10-CM

## 2018-01-02 DIAGNOSIS — M549 Dorsalgia, unspecified: Secondary | ICD-10-CM | POA: Insufficient documentation

## 2018-01-02 MED ORDER — BACLOFEN 10 MG PO TABS: 10.0000 mg | ORAL_TABLET | Freq: Three times a day (TID) | ORAL | 0 refills | Status: DC

## 2018-01-02 NOTE — Progress Notes (Signed)
Internal Medicine Clinic Attending  Case discussed with Dr. Lovenia KimSantos-Sanchez at the time of the visit.  We reviewed the resident's history and exam and pertinent patient test results.  I agree with the assessment, diagnosis, and plan of care documented in the resident's note. Has appt in Oct with Dr Tonette LedererVogal to address CKD.

## 2018-01-02 NOTE — Progress Notes (Signed)
   CC: Back pain follow up   HPI:  [name] is a 47 y.o. male with PMH listed below who presents to clinic for evaluation of back pain.  Acute lower back pain: [name] presents today for evaluation of acute onset of left-sided lower back pain after lifting a fish table 2 weeks ago.  He was evaluated in the ED on 8/6 and 8/16 at which time CT renal protocol and CT abdomen/pelvis showed no acute abnormalities to explain his back pain.  He was sent home on Robaxin and naproxen which have not helped with the pain.  The pain is nonradiating, worsens with sitting up, and improves with rest and ambulation.  Denies other recent trauma or illness, fevers, and chills.  No LE weakness or bladder/bowel incontinence. On exam, he has left paraspinal muscle tenderness but no neurological deficits.  Suspect back pain is MSK in nature especially with given onset after lifting heavy object.  Will give a short course of muscle relaxant and expect pain to improve within the next 2 weeks. - Baclofen 10 mg 3 times daily as needed (15 tablets, no refills) - Avoid lifting heavy objects x 2 weeks  - Recommended ice and heat to help with pain  - Needs follow-up appointment with PCP to address issues (rising Cr and elevated BP)  PMH:  Cervical radiculopathy   Review of Systems:   Review of Systems  Constitutional: Negative for chills, fever, malaise/fatigue and weight loss.  Musculoskeletal: Positive for back pain and myalgias. Negative for falls.  Neurological: Negative for dizziness, sensory change, focal weakness, weakness and headaches.   Physical Exam: Vitals:   01/02/18 0934 01/02/18 0959  BP: (!) 140/91 (!) 147/85  Pulse: 60 63  Temp: 97.7 F (36.5 C)   TempSrc: Oral   SpO2: 98% 98%  Weight: 231 lb 12.8 oz (105.1 kg)   Height: 5\' 6"  (1.676 m)    General: Well-appearing obese male in no acute distress Back: Left paraspinal muscle tenderness in lumbar spine, decreased flexion due to  pain Neuro: Alert and oriented x3, sensation and strength intact in all 4 extremities, no gait abnormalities  Assessment & Plan:   See Encounters Tab for problem based charting.  Patient discussed with Dr. Rogelia BogaButcher

## 2018-01-02 NOTE — Assessment & Plan Note (Signed)
Acute lower back pain: [name] presents today for evaluation of acute onset of left-sided lower back pain after lifting a fish table 2 weeks ago.  He was evaluated in the ED on 8/6 and 8/16 at which time CT renal protocol and CT abdomen/pelvis showed no acute abnormalities to explain his back pain.  He was sent home on Robaxin and naproxen which have not helped with the pain.  The pain is nonradiating, worsens with sitting up, and improves with rest and ambulation.  Denies other recent trauma or illness, fevers, and chills.  No LE weakness or bladder/bowel incontinence. On exam, he has left paraspinal muscle tenderness but no neurological deficits.  Suspect back pain is MSK in nature especially with given onset after lifting heavy object.  Will give a short course of muscle relaxant and expect pain to improve within the next 2 weeks. - Baclofen 10 mg 3 times daily as needed (15 tablets, no refills) - Avoid lifting heavy objects x 2 weeks  - Recommended ice and heat to help with pain  - Needs follow-up appointment with PCP to address issues (rising Cr and elevated BP)

## 2018-01-02 NOTE — Patient Instructions (Signed)
[name],   For your back pain, please start taking baclofen 1 tablet up to 3 times a day as needed.  He can also use ice and/or heat in the affected area to help with pain.  Suspect your back pain to improve with the next 2 to 4 weeks.  Please avoid lifting any heavy object in the meantime.  Please call if you have any questions or concerns.  - Dr. Evelene CroonSantos

## 2018-01-10 ENCOUNTER — Ambulatory Visit (INDEPENDENT_AMBULATORY_CARE_PROVIDER_SITE_OTHER): Payer: Self-pay | Admitting: Internal Medicine

## 2018-01-10 ENCOUNTER — Encounter: Payer: Self-pay | Admitting: Internal Medicine

## 2018-01-10 VITALS — BP 138/84 | HR 72 | Temp 98.4°F | Wt 231.2 lb

## 2018-01-10 DIAGNOSIS — R1011 Right upper quadrant pain: Secondary | ICD-10-CM

## 2018-01-10 DIAGNOSIS — R109 Unspecified abdominal pain: Secondary | ICD-10-CM

## 2018-01-10 DIAGNOSIS — M545 Low back pain, unspecified: Secondary | ICD-10-CM

## 2018-01-10 DIAGNOSIS — Z0000 Encounter for general adult medical examination without abnormal findings: Secondary | ICD-10-CM

## 2018-01-10 DIAGNOSIS — K760 Fatty (change of) liver, not elsewhere classified: Secondary | ICD-10-CM

## 2018-01-10 MED ORDER — DICLOFENAC SODIUM 1 % TD GEL: 2.0000 g | Freq: Four times a day (QID) | TRANSDERMAL | 0 refills | Status: AC

## 2018-01-10 MED ORDER — CYCLOBENZAPRINE HCL 5 MG PO TABS: 5.0000 mg | ORAL_TABLET | Freq: Two times a day (BID) | ORAL | 0 refills | Status: AC | PRN

## 2018-01-10 NOTE — Progress Notes (Signed)
   CC: Follow up regarding lower back pain  HPI:  [name] is a 47 y.o. with past medical history documented below presents for back pain. Please see problem based charting for evaluation, assessment, and plan.  No past medical history on file.  Review of Systems:    Left lower back and left abdominal pain Denies nausea/vomiting, sob, chest pain  Physical Exam:  Vitals:   01/10/18 1055  BP: 138/84  Pulse: 72  Temp: 98.4 F (36.9 C)  TempSrc: Oral  SpO2: 98%  Weight: 231 lb 3.2 oz (104.9 kg)   Physical Exam  Constitutional: He appears well-developed and well-nourished. No distress.  HENT:  Head: Normocephalic and atraumatic.  Eyes: Conjunctivae are normal.  Cardiovascular: Normal rate, regular rhythm and normal heart sounds.  Respiratory: Effort normal and breath sounds normal. No respiratory distress. He has no wheezes.  GI: Soft. Bowel sounds are normal. He exhibits no distension. There is no tenderness.  Musculoskeletal: He exhibits no edema.  5/5 muscle strength in bilateral lower extremities, no sensory deficits. No pain with heel or toe walking. Negative straight leg test bilaterally  Neurological: He is alert.  Skin: He is not diaphoretic. No erythema.  Psychiatric: He has a normal mood and affect. His behavior is normal. Judgment and thought content normal.   Assessment & Plan:   See Encounters Tab for problem based charting.  Patient discussed with Dr. Heide SparkNarendra

## 2018-01-10 NOTE — Patient Instructions (Signed)
It was a pleasure to see you today [name]. I am sorry to hear about your back pain. Please stop taking baclofen, ibuprofen, and your friend's oxycodone. Please use flexeril and voltaren gel that I have prescribed for you. Placing heat to your back should help. Also please try lower back exercises that I have included below.  If you have any questions or concerns, please call our clinic at [phone] between 9am-5pm and after hours call [phone] and ask for the internal medicine resident on call. If you feel you are having a medical emergency please call 911.   Thank you, we look forward to help you remain healthy!  Joe CourierVahini Tobey Schmelzle, MD Internal Medicine PGY2   Muscle Cramps and Spasms Muscle cramps and spasms are when muscles tighten by themselves. They usually get better within minutes. Muscle cramps are painful. They are usually stronger and last longer than muscle spasms. Muscle spasms may or may not be painful. They can last a few seconds or much longer. Follow these instructions at home:  Drink enough fluid to keep your pee (urine) clear or pale yellow.  Massage, stretch, and relax the muscle.  If directed, apply heat to tight or tense muscles as often as told by your doctor. Use the heat source that your doctor recommends. ? Place a towel between your skin and the heat source. ? Leave the heat on for 20-30 minutes. ? Take off the heat if your skin turns bright red. This is especially important if you are unable to feel pain, heat, or cold. You may have a greater risk of getting burned.  If directed, put ice on the affected area. This may help if you are sore or have pain after a cramp or spasm. ? Put ice in a plastic bag. ? Place a towel between your skin and the bag. ? Leave the ice on for 20 minutes, 2-3 times a day.  Take over-the-counter and prescription medicines only as told by your doctor.  Pay attention to any changes in your symptoms. Contact a doctor if:  Your  cramps or spasms get worse or happen more often.  Your cramps or spasms do not get better with time. This information is not intended to replace advice given to you by your health care provider. Make sure you discuss any questions you have with your health care provider. Document Released: 04/14/2008 Document Revised: 06/03/2015 Document Reviewed: 02/03/2015 Elsevier Interactive Patient Education  2018 [employer]

## 2018-01-11 DIAGNOSIS — K760 Fatty (change of) liver, not elsewhere classified: Secondary | ICD-10-CM | POA: Insufficient documentation

## 2018-01-11 NOTE — Assessment & Plan Note (Addendum)
The patient was recently seen for an ED visit on 12/30/2017 for left-sided abdominal pain of 12-14 during the day duration.  He got a CT abdomen pelvis that visit that showed diffuse fatty infiltration of the liver.    His lipase has been normal at 35. Normal liver enzymes and no bilirubin elevation.  During the clinic visit the patient denies nausea, vomiting, diarrhea.   Assessment and plan During the patient's ED visit the ED provider recommended gastroenterology referral for colonoscopy and possible EGD. However, there is no urgent need for this at this time.  Will recommend the patient continue to be monitored for his GI symptoms and proceed with GI referral at that time if necessary.  His liver enzymes and ruq ultrasound should be followed every 2-2157yrs for NAFLD screening.

## 2018-01-11 NOTE — Assessment & Plan Note (Signed)
Patient presents with a 3-week history of lower back pain that started after he was moving a game table.  He states that the game table was extremely heavy and most of the weight when carrying the table fell on to him.  2 days later he noted lower back pain that was 10/10 in intensity, constantly present, pulsating in nature, worsened when he sits down.  He states that he has used ibuprofen and friend's oxycodone.  He said that he felt more relief from the oxycodone.  At previous visit the patient was prescribed baclofen which he said that he has not had any relief from.  Patient does not have any red flag symptoms of fever, saddle analgesia, urine or fecal incontinence.  Assessment and plan The patient's acute back pain is likely from musculoskeletal etiology.  On examination of his back his latissimus dorsi muscle was more intense on his left side than his right.   Counseled the patient to avoid NSAIDs as he has been having progressively worsening kidney injury.  Also informed him that he should not be using his friend's oxycodone as it is not indicated for such type of back pain.  -Prescribed patient Voltaren gel and Flexeril 5 mg twice daily for 10 days

## 2018-01-15 NOTE — Progress Notes (Signed)
Internal Medicine Clinic Attending  Case discussed with Dr. Chundi at the time of the visit.  We reviewed the resident's history and exam and pertinent patient test results.  I agree with the assessment, diagnosis, and plan of care documented in the resident's note. 

## 2018-01-26 ENCOUNTER — Ambulatory Visit: Payer: Self-pay

## 2018-02-15 ENCOUNTER — Other Ambulatory Visit: Payer: Self-pay

## 2018-02-15 ENCOUNTER — Ambulatory Visit (INDEPENDENT_AMBULATORY_CARE_PROVIDER_SITE_OTHER): Payer: Self-pay | Admitting: Internal Medicine

## 2018-02-15 VITALS — BP 130/84 | HR 82 | Temp 98.7°F | Wt 225.6 lb

## 2018-02-15 DIAGNOSIS — E6609 Other obesity due to excess calories: Secondary | ICD-10-CM

## 2018-02-15 DIAGNOSIS — N182 Chronic kidney disease, stage 2 (mild): Secondary | ICD-10-CM

## 2018-02-15 DIAGNOSIS — K760 Fatty (change of) liver, not elsewhere classified: Secondary | ICD-10-CM

## 2018-02-15 DIAGNOSIS — Z0000 Encounter for general adult medical examination without abnormal findings: Secondary | ICD-10-CM

## 2018-02-15 DIAGNOSIS — Z6836 Body mass index (BMI) 36.0-36.9, adult: Secondary | ICD-10-CM

## 2018-02-15 NOTE — Progress Notes (Signed)
   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   No past medical history on file.  PMH: NAFLD Acute back pain    Physical Exam:  Vitals:   02/15/18 1352 02/15/18 1457  BP: 137/74 130/84  Pulse: 87 82  Temp: 98.7 F (37.1 C)   TempSrc: Oral   SpO2: 97%   Weight: 225 lb 9.6 oz (102.3 kg)    Gen: Well appearing, NAD CV: RRR, no murmurs Pulm: Normal effort, CTA throughout, no wheezing Abd: protuberant, Soft, NT, ND, normal BS.  Ext: Warm, no edema, normal joints   Assessment & Plan:   See Encounters Tab for problem based charting.  Patient seen with Dr. Oswaldo Done

## 2018-02-15 NOTE — Patient Instructions (Addendum)
It was nice seeing you today. Thank you for choosing [facility] for your Primary Care.   Today we talked about:  1) I sent a referal to the nutritionist. Someone will call you to schedule an appointment 2) I'll call you with any abnormal blood work    FOLLOW-UP INSTRUCTIONS When: 2 months For: weight loss  Please contact the clinic if you have any problems, or need to be seen sooner.    DASH Eating Plan DASH stands for "Dietary Approaches to Stop Hypertension." The DASH eating plan is a healthy eating plan that has been shown to reduce high blood pressure (hypertension). It may also reduce your risk for type 2 diabetes, heart disease, and stroke. The DASH eating plan may also help with weight loss. What are tips for following this plan? General guidelines  Avoid eating more than 2,300 mg (milligrams) of salt (sodium) a day. If you have hypertension, you may need to reduce your sodium intake to 1,500 mg a day.  Limit alcohol intake to no more than 1 drink a day for nonpregnant women and 2 drinks a day for men. One drink equals 12 oz of beer, 5 oz of wine, or 1 oz of hard liquor.  Work with your health care provider to maintain a healthy body weight or to lose weight. Ask what an ideal weight is for you.  Get at least 30 minutes of exercise that causes your heart to beat faster (aerobic exercise) most days of the week. Activities may include walking, swimming, or biking.  Work with your health care provider or diet and nutrition specialist (dietitian) to adjust your eating plan to your individual calorie needs. Reading food labels  Check food labels for the amount of sodium per serving. Choose foods with less than 5 percent of the Daily Value of sodium. Generally, foods with less than 300 mg of sodium per serving fit into this eating plan.  To find whole grains, look for the word "whole" as the first word in the ingredient list. Shopping  Buy products labeled as  "low-sodium" or "no salt added."  Buy fresh foods. Avoid canned foods and premade or frozen meals. Cooking  Avoid adding salt when cooking. Use salt-free seasonings or herbs instead of table salt or sea salt. Check with your health care provider or pharmacist before using salt substitutes.  Do not fry foods. Cook foods using healthy methods such as baking, boiling, grilling, and broiling instead.  Cook with heart-healthy oils, such as olive, canola, soybean, or sunflower oil. Meal planning   Eat a balanced diet that includes: ? 5 or more servings of fruits and vegetables each day. At each meal, try to fill half of your plate with fruits and vegetables. ? Up to 6-8 servings of whole grains each day. ? Less than 6 oz of lean meat, poultry, or fish each day. A 3-oz serving of meat is about the same size as a deck of cards. One egg equals 1 oz. ? 2 servings of low-fat dairy each day. ? A serving of nuts, seeds, or beans 5 times each week. ? Heart-healthy fats. Healthy fats called Omega-3 fatty acids are found in foods such as flaxseeds and coldwater fish, like sardines, salmon, and mackerel.  Limit how much you eat of the following: ? Canned or prepackaged foods. ? Food that is high in trans fat, such as fried foods. ? Food that is high in saturated fat, such as fatty meat. ? Sweets, desserts, sugary drinks,  and other foods with added sugar. ? Full-fat dairy products.  Do not salt foods before eating.  Try to eat at least 2 vegetarian meals each week.  Eat more home-cooked food and less restaurant, buffet, and fast food.  When eating at a restaurant, ask that your food be prepared with less salt or no salt, if possible. What foods are recommended? The items listed may not be a complete list. Talk with your dietitian about what dietary choices are best for you. Grains Whole-grain or whole-wheat bread. Whole-grain or whole-wheat pasta. Brown rice. Oatmeal. Quinoa. Bulgur. Whole-grain  and low-sodium cereals. Pita bread. Low-fat, low-sodium crackers. Whole-wheat flour tortillas. Vegetables Fresh or frozen vegetables (raw, steamed, roasted, or grilled). Low-sodium or reduced-sodium tomato and vegetable juice. Low-sodium or reduced-sodium tomato sauce and tomato paste. Low-sodium or reduced-sodium canned vegetables. Fruits All fresh, dried, or frozen fruit. Canned fruit in natural juice (without added sugar). Meat and other protein foods Skinless chicken or turkey. Ground chicken or turkey. Pork with fat trimmed off. Fish and seafood. Egg whites. Dried beans, peas, or lentils. Unsalted nuts, nut butters, and seeds. Unsalted canned beans. Lean cuts of beef with fat trimmed off. Low-sodium, lean deli meat. Dairy Low-fat (1%) or fat-free (skim) milk. Fat-free, low-fat, or reduced-fat cheeses. Nonfat, low-sodium ricotta or cottage cheese. Low-fat or nonfat yogurt. Low-fat, low-sodium cheese. Fats and oils Soft margarine without trans fats. Vegetable oil. Low-fat, reduced-fat, or light mayonnaise and salad dressings (reduced-sodium). Canola, safflower, olive, soybean, and sunflower oils. Avocado. Seasoning and other foods Herbs. Spices. Seasoning mixes without salt. Unsalted popcorn and pretzels. Fat-free sweets. What foods are not recommended? The items listed may not be a complete list. Talk with your dietitian about what dietary choices are best for you. Grains Baked goods made with fat, such as croissants, muffins, or some breads. Dry pasta or rice meal packs. Vegetables Creamed or fried vegetables. Vegetables in a cheese sauce. Regular canned vegetables (not low-sodium or reduced-sodium). Regular canned tomato sauce and paste (not low-sodium or reduced-sodium). Regular tomato and vegetable juice (not low-sodium or reduced-sodium). Pickles. Olives. Fruits Canned fruit in a light or heavy syrup. Fried fruit. Fruit in cream or butter sauce. Meat and other protein foods Fatty cuts  of meat. Ribs. Fried meat. Bacon. Sausage. Bologna and other processed lunch meats. Salami. Fatback. Hotdogs. Bratwurst. Salted nuts and seeds. Canned beans with added salt. Canned or smoked fish. Whole eggs or egg yolks. Chicken or turkey with skin. Dairy Whole or 2% milk, cream, and half-and-half. Whole or full-fat cream cheese. Whole-fat or sweetened yogurt. Full-fat cheese. Nondairy creamers. Whipped toppings. Processed cheese and cheese spreads. Fats and oils Butter. Stick margarine. Lard. Shortening. Ghee. Bacon fat. Tropical oils, such as coconut, palm kernel, or palm oil. Seasoning and other foods Salted popcorn and pretzels. Onion salt, garlic salt, seasoned salt, table salt, and sea salt. Worcestershire sauce. Tartar sauce. Barbecue sauce. Teriyaki sauce. Soy sauce, including reduced-sodium. Steak sauce. Canned and packaged gravies. Fish sauce. Oyster sauce. Cocktail sauce. Horseradish that you find on the shelf. Ketchup. Mustard. Meat flavorings and tenderizers. Bouillon cubes. Hot sauce and Tabasco sauce. Premade or packaged marinades. Premade or packaged taco seasonings. Relishes. Regular salad dressings. Where to find more information:  National Heart, Lung, and Blood Institute: www.nhlbi.nih.gov  American Heart Association: www.heart.org Summary  The DASH eating plan is a healthy eating plan that has been shown to reduce high blood pressure (hypertension). It may also reduce your risk for type 2 diabetes, heart disease, and stroke.    With the DASH eating plan, you should limit salt (sodium) intake to 2,300 mg a day. If you have hypertension, you may need to reduce your sodium intake to 1,500 mg a day.  When on the DASH eating plan, aim to eat more fresh fruits and vegetables, whole grains, lean proteins, low-fat dairy, and heart-healthy fats.  Work with your health care provider or diet and nutrition specialist (dietitian) to adjust your eating plan to your individual calorie  needs. This information is not intended to replace advice given to you by your health care provider. Make sure you discuss any questions you have with your health care provider. Document Released: 04/21/2011 Document Revised: 04/25/2016 Document Reviewed: 04/25/2016 Elsevier Interactive Patient Education  [employer].

## 2018-02-16 ENCOUNTER — Encounter: Payer: Self-pay | Admitting: Internal Medicine

## 2018-02-16 ENCOUNTER — Other Ambulatory Visit (INDEPENDENT_AMBULATORY_CARE_PROVIDER_SITE_OTHER): Payer: Self-pay

## 2018-02-16 DIAGNOSIS — Z6836 Body mass index (BMI) 36.0-36.9, adult: Secondary | ICD-10-CM

## 2018-02-16 DIAGNOSIS — E6609 Other obesity due to excess calories: Secondary | ICD-10-CM | POA: Insufficient documentation

## 2018-02-16 DIAGNOSIS — M549 Dorsalgia, unspecified: Secondary | ICD-10-CM

## 2018-02-16 DIAGNOSIS — N182 Chronic kidney disease, stage 2 (mild): Secondary | ICD-10-CM | POA: Insufficient documentation

## 2018-02-16 LAB — POCT GLYCOSYLATED HEMOGLOBIN (HGB A1C): Hemoglobin A1C: 5.6 % (ref 4.0–5.6)

## 2018-02-16 LAB — GLUCOSE, CAPILLARY: Glucose-Capillary: 102 mg/dL — ABNORMAL HIGH (ref 70–99)

## 2018-02-16 NOTE — Assessment & Plan Note (Signed)
Expresses desire to lose weight as he thinks his weight is contributing to his back pain. We discussed diet and exercise. He does not have any exercise routine. He has lost 17lbs in 1.5 months before and is motivated to lose weight before the holidays.   Plan: - MNT referral to Lupita Leash - provided with info on the DASH diet  - f/u 2 months

## 2018-02-16 NOTE — Assessment & Plan Note (Addendum)
Declines flu shot

## 2018-02-16 NOTE — Assessment & Plan Note (Signed)
Creatine 1.35 - 1.50 over the last two years with most recent GFR 58. Two months ago, urinalysis without proteinuria or hematuria and CT abd without evidence of hydronephrosis. He does not take any medications routinely. Denies obstructive urinary symptoms such as hesitancy or weak stream.   Plan: - avoid nephrotoxic meds - recheck BMP - check lipid panel and a1c  Addendum:  - a1c is 5.6

## 2018-02-17 LAB — BMP8+ANION GAP
Anion Gap: 15 mmol/L (ref 10.0–18.0)
BUN/Creatinine Ratio: 16 (ref 9–20)
BUN: 20 mg/dL (ref 6–24)
CALCIUM: 9.4 mg/dL (ref 8.7–10.2)
CO2: 26 mmol/L (ref 20–29)
CREATININE: 1.25 mg/dL (ref 0.76–1.27)
Chloride: 103 mmol/L (ref 96–106)
GFR calc Af Amer: 78 mL/min/{1.73_m2} (ref >59)
GFR calc non Af Amer: 67 mL/min/{1.73_m2} (ref >59)
Glucose: 113 mg/dL — ABNORMAL HIGH (ref 65–99)
POTASSIUM: 4.9 mmol/L (ref 3.5–5.2)
SODIUM: 144 mmol/L (ref 134–144)

## 2018-02-17 LAB — LIPID PANEL
CHOL/HDL RATIO: 3.6 ratio (ref 0.0–5.0)
Cholesterol, Total: 142 mg/dL (ref 100–199)
HDL: 40 mg/dL (ref >39)
LDL CALC: 90 mg/dL (ref 0–99)
Triglycerides: 59 mg/dL (ref 0–149)
VLDL Cholesterol Cal: 12 mg/dL (ref 5–40)

## 2018-02-19 NOTE — Addendum Note (Signed)
Addended by: Erlinda Hong T on: 02/19/2018 07:52 AM   Modules accepted: Level of Service

## 2018-02-19 NOTE — Progress Notes (Signed)
Internal Medicine Clinic Attending  I saw and evaluated the patient.  I personally confirmed the key portions of the history and exam documented by Dr. Vogel and I reviewed pertinent patient test results.  The assessment, diagnosis, and plan were formulated together and I agree with the documentation in the resident's note.  

## 2018-03-28 NOTE — Progress Notes (Signed)
   CC: Blurry vision, toenail discoloration  HPI:  [name] is a 47 y.o. male who presents for evaluation of blurry vision and also evaluation of left great toenail discoloration.  He is otherwise doing well.  At last visit we discussed his weight and unfortunately he has gained 10 pounds in the last month and a half.  He states that he is not very physically active and his eating habits are very irregular.  He is motivated to lose weight.  In regards to his blurry vision, he describes it as gradually worsening and chronic.  He denies eye pain, trauma or diplopia.  He wore glasses years ago but they gave him "migraine-like headaches."  He has not seen an eye doctor recently.  Regards to his left toenail discoloration.  He describes this as chronic and intermittently painful.  He states that he got a pedicure a couple months ago and the nail was cut down significantly.  He then put hydrogen peroxide on it daily, but the nail has fully grown back and continues to be thick and yellow.   Past Medical History:  Diagnosis Date  . NAFLD (nonalcoholic fatty liver disease)    Review of Systems: Denies headache, vomiting  Physical Exam:  Vitals:   03/29/18 1324  BP: (!) 142/78  Pulse: 78  Temp: 98.7 F (37.1 C)  TempSrc: Oral  SpO2: 99%  Weight: 235 lb 12.8 oz (107 kg)  Height: 5\' 6"  (1.676 m)   Gen: Well appearing, NAD Eyes: No visual field defect.  Pupils are equal round and reactive to light bilaterally.  Extraocular movements are intact.  Conjunctiva is normal.  Cornea is clear. CV: RRR, no murmurs Pulm: Normal effort, CTA throughout, no wheezing   Assessment & Plan:   See Encounters Tab for problem based charting.  Patient seen with Dr. Criselda PeachesMullen

## 2018-03-28 NOTE — Patient Instructions (Addendum)
It was nice seeing you today. Thank you for choosing [facility] for your Primary Care.   Today we talked about:  1) Blurry vision: Please have your vision checked by any optometrist.  Sounds like you need glasses for distance vision. 2) toenail discoloration: It looks like you have a fungal infection of your toenail.  This is relatively common but difficult to treat.  I have sent 2 different medicines to your pharmacy.  One is a pill and one is a topical solution.  Please buy whichever one you prefer.  Do not by both of them. 3) Continue working on weight loss!  I hope you will at least maintain your weight through the holiday season but you told me your goal is to lose 10 pounds so next visit I want to see your weight at 125lbs!   Happy Holidays   FOLLOW-UP INSTRUCTIONS When: 6 weeks for lab only visit   Please contact the clinic if you have any problems, or need to be seen sooner.    Fungal Nail Infection Fungal nail infection is a common fungal infection of the toenails or fingernails. This condition affects toenails more often than fingernails. More than one nail may be infected. The condition can be passed from person to person (is contagious). What are the causes? This condition is caused by a fungus. Several types of funguses can cause the infection. These funguses are common in moist and warm areas. If your hands or feet come into contact with the fungus, it may get into a crack in your fingernail or toenail and cause the infection. What increases the risk? The following factors may make you more likely to develop this condition:  Being male.  Having diabetes.  Being of older age.  Living with someone who has the fungus.  Walking barefoot in areas where the fungus thrives, such as showers or locker rooms.  Having poor circulation.  Wearing shoes and socks that cause your feet to sweat.  Having athlete's foot.  Having a nail injury or history of a recent  nail surgery.  Having psoriasis.  Having a weak body defense system (immune system).  What are the signs or symptoms? Symptoms of this condition include:  A pale spot on the nail.  Thickening of the nail.  A nail that becomes yellow or brown.  A brittle or ragged nail edge.  A crumbling nail.  A nail that has lifted away from the nail bed.  How is this diagnosed? This condition is diagnosed with a physical exam. Your health care provider may take a scraping or clipping from your nail to test for the fungus. How is this treated? Mild infections do not need treatment. If you have significant nail changes, treatment may include:  Oral antifungal medicines. You may need to take the medicine for several weeks or several months, and you may not see the results for a long time. These medicines can cause side effects. Ask your health care provider what problems to watch for.  Antifungal nail polish and nail cream. These may be used along with oral antifungal medicines.  Laser treatment of the nail.  Surgery to remove the nail. This may be needed for the most severe infections.  Treatment takes a long time, and the infection may come back. Follow these instructions at home: Medicines  Take or apply over-the-counter and prescription medicines only as told by your health care provider.  Ask your health care provider about using over-the-counter mentholated ointment on  your nails. Lifestyle   Do not share personal items, such as towels or nail clippers.  Trim your nails often.  Wash and dry your hands and feet every day.  Wear absorbent socks, and change your socks frequently.  Wear shoes that allow air to circulate, such as sandals or canvas tennis shoes. Throw out old shoes.  Wear rubber gloves if you are working with your hands in wet areas.  Do not walk barefoot in shower rooms or locker rooms.  Do not use a nail salon that does not use clean instruments.  Do not  use artificial nails. General instructions  Keep all follow-up visits as told by your health care provider. This is important.  Use antifungal foot powder on your feet and in your shoes. Contact a health care provider if: Your infection is not getting better or it is getting worse after several months. This information is not intended to replace advice given to you by your health care provider. Make sure you discuss any questions you have with your health care provider. Document Released: 04/29/2000 Document Revised: 10/08/2015 Document Reviewed: 11/03/2014 Elsevier Interactive Patient Education  2018 [employer].

## 2018-03-29 ENCOUNTER — Ambulatory Visit (INDEPENDENT_AMBULATORY_CARE_PROVIDER_SITE_OTHER): Payer: Self-pay | Admitting: Dietician

## 2018-03-29 ENCOUNTER — Ambulatory Visit (INDEPENDENT_AMBULATORY_CARE_PROVIDER_SITE_OTHER): Payer: Self-pay | Admitting: Internal Medicine

## 2018-03-29 ENCOUNTER — Other Ambulatory Visit: Payer: Self-pay

## 2018-03-29 ENCOUNTER — Encounter: Payer: Self-pay | Admitting: Internal Medicine

## 2018-03-29 VITALS — BP 142/78 | HR 78 | Temp 98.7°F | Ht 66.0 in | Wt 235.8 lb

## 2018-03-29 DIAGNOSIS — R635 Abnormal weight gain: Secondary | ICD-10-CM

## 2018-03-29 DIAGNOSIS — K760 Fatty (change of) liver, not elsewhere classified: Secondary | ICD-10-CM

## 2018-03-29 DIAGNOSIS — B351 Tinea unguium: Secondary | ICD-10-CM

## 2018-03-29 DIAGNOSIS — Z713 Dietary counseling and surveillance: Secondary | ICD-10-CM

## 2018-03-29 DIAGNOSIS — E6609 Other obesity due to excess calories: Secondary | ICD-10-CM

## 2018-03-29 DIAGNOSIS — Z6836 Body mass index (BMI) 36.0-36.9, adult: Principal | ICD-10-CM

## 2018-03-29 DIAGNOSIS — H538 Other visual disturbances: Secondary | ICD-10-CM

## 2018-03-29 DIAGNOSIS — Z6838 Body mass index (BMI) 38.0-38.9, adult: Secondary | ICD-10-CM

## 2018-03-29 MED ORDER — TERBINAFINE HCL 250 MG PO TABS: 250.0000 mg | ORAL_TABLET | Freq: Every day | ORAL | 0 refills | Status: AC

## 2018-03-29 MED ORDER — EFINACONAZOLE 10 % EX SOLN: 1.0000 "application " | Freq: Every day | CUTANEOUS | 1 refills | Status: DC

## 2018-03-29 NOTE — Assessment & Plan Note (Signed)
Chronic, worsening.  Subjectively left worse than right.  Worse after staring at his phone.  Denies pain or trauma.  No diplopia or visual field defect.  No evidence of cataracts on exam.   Plan: -Recommended eye exam by any optometrist

## 2018-03-29 NOTE — Patient Instructions (Addendum)
Change 1- Ordering water when I eat out                     Buying  Bottled water in stead of buying soda  Change 2- walking 60 minutes a day for 4 days a week  Bring your cell phone  Lupita LeashDonna [phone]

## 2018-03-29 NOTE — Progress Notes (Signed)
  Medical Nutrition Therapy:  Appt start time: 1420 end time:  1500. Visit # 1  Assessment:  Primary concerns today: weight loss  Patient reports that he wants to loose weight and recognizes that his sugar sweetened beverage consumption contribute to his weight. He also acknowledges that he is sedentary and needs to increase his level of physical activity to improve his weight and health.   Preferred Learning Style: No preference indicated  Learning Readiness: Ready  Estimated body mass index is 38.06 kg/m as calculated from the following:   Height as of an earlier encounter on 03/29/18: 5\' 6"  (1.676 m).   Weight as of an earlier encounter on 03/29/18: 235 lb 12.8 oz (107 kg).  Wt Readings from Last 5 Encounters:  03/29/18 235 lb 12.8 oz (107 kg)  02/15/18 225 lb 9.6 oz (102.3 kg)  01/10/18 231 lb 3.2 oz (104.9 kg)  01/02/18 231 lb 12.8 oz (105.1 kg)  12/29/17 255 lb (115.7 kg)    SLEEP: Usually sleeps 9pm to 8 am, but reports that he wakes up once or twice a week and eats. He has also been told that he snores.  DIETARY INTAKE: Usual eating pattern includes 2 meals and 0-1 snacks per day. Dining Out (times/week): 7-8 24-hr recall:  B ( AM): skips  L ( PM): eats out ([employer], hot dogs, ch[employer] chicken sandwich, with soda/tea) D ( PM): bologna and cheese sandwich, Pepsi Snk ( PM): (1-2 nts/week late night meal, 1-2 am) large bowl of Lucky Charms with milk, or bologna/cheese sandwich Beverages: gallon of sweet tea every week, in addition to 1 1/2 sodas (Pepsi) per day, 3 bottled water a day  Usual physical activity: sedentary; watches 6-7 hrs of tv per day Estimated energy needs: 1800-2000 calories/daily for weight loss   Progress Towards Goal(s):  In progress.   Nutritional Diagnosis:  NI-1.5 Excessive energy intake As related to consumption of excessive amounts of sugar sweetened beverages .  As evidenced by patient reports of drinking multiple sugar sweetened beverages each  day, and BMI..    Intervention:  Nutrition Counseling using motivational interviewing  Action Goal: 1) Stop ordering soda/sweet tea at restaurants and buy more bottled water. 2) Walk for 30 minutes, 4 days/week Outcome goal: Weight loss and improved health  Teaching Method Utilized: Visual, Auditory Barriers to learning/adherence to lifestyle change: eating out Demonstrated degree of understanding via:  Teach Back   Monitoring/Evaluation:  Dietary intake, exercise, and body weight in 1 month(s).  Norm Parcelonna Coburn Knaus, RD 03/29/2018 3:37 PM.

## 2018-03-29 NOTE — Assessment & Plan Note (Addendum)
Chronic.  Untreated.  The entirety of the left great toe nail is yellow and thickened, most consistent with onychomycosis.  Intermittently painful.  Plan: -Terbinafine 250 mg daily p.o. for 12 weeks.  Due to known fatty liver disease, patient will need 6-week lab visit to monitor LFTs while on this therapy. -Also prescribed efinaconazole solution, but I expect this will be too expensive.  Patient instructed to purchase whichever medicine he prefers.  If he chooses efinaconazole, we do not need to monitor his liver function.

## 2018-04-03 NOTE — Progress Notes (Signed)
Internal Medicine Clinic Attending  I saw and evaluated the patient.  I personally confirmed the key portions of the history and exam documented by Dr. Vogel and I reviewed pertinent patient test results.  The assessment, diagnosis, and plan were formulated together and I agree with the documentation in the resident's note.  

## 2018-05-03 ENCOUNTER — Ambulatory Visit: Payer: Self-pay | Admitting: Dietician

## 2018-05-03 ENCOUNTER — Encounter: Payer: Self-pay | Admitting: Internal Medicine

## 2018-06-15 NOTE — Addendum Note (Signed)
Addended by: Remus Blake on: 06/15/2018 02:31 PM   Modules accepted: Orders

## 2018-08-09 ENCOUNTER — Encounter: Payer: Self-pay | Admitting: Internal Medicine

## 2018-09-13 ENCOUNTER — Ambulatory Visit (INDEPENDENT_AMBULATORY_CARE_PROVIDER_SITE_OTHER): Payer: Self-pay | Admitting: Internal Medicine

## 2018-09-13 ENCOUNTER — Other Ambulatory Visit: Payer: Self-pay

## 2018-09-13 DIAGNOSIS — K602 Anal fissure, unspecified: Secondary | ICD-10-CM

## 2018-09-13 DIAGNOSIS — G4700 Insomnia, unspecified: Secondary | ICD-10-CM

## 2018-09-13 DIAGNOSIS — K219 Gastro-esophageal reflux disease without esophagitis: Secondary | ICD-10-CM | POA: Insufficient documentation

## 2018-09-13 MED ORDER — LIDOCAINE HCL 2 % EX GEL: 1.0000 "application " | CUTANEOUS | 0 refills | Status: DC | PRN

## 2018-09-13 MED ORDER — PANTOPRAZOLE SODIUM 40 MG PO TBEC: 40.0000 mg | DELAYED_RELEASE_TABLET | Freq: Every day | ORAL | 3 refills | Status: DC

## 2018-09-13 NOTE — Progress Notes (Signed)
Internal Medicine Clinic Attending  Case discussed with Dr. Vogel  at the time of the visit.  We reviewed the resident's history and exam and pertinent patient test results.  I agree with the assessment, diagnosis, and plan of care documented in the resident's note.  

## 2018-09-13 NOTE — Assessment & Plan Note (Signed)
Describes recurrence of rectal bleeding and painful BMs. His BMs are irregular and hard. He has painful BMs, described as a dull pain that occurs with BM and lasts afterwards. His stool is hard and has light red blood around it and in the toilet bowl. 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 He has tried preparation H without relief. Denies lightheadedness, dizziness, or worsening sob.   - Rx lidocaine gel 2% - instructed to increase daily fiber intake and start metamucil - Goal is to have 1-2 soft BMs daily

## 2018-09-13 NOTE — Progress Notes (Signed)
  [facility] Internal Medicine Residency Telephone Encounter Continuity Care Appointment  HPI:   This telephone encounter was created for [name] on 09/13/2018 for the following purpose/cc gerd, anal fissure, insomnia.   Past Medical History:  Past Medical History:  Diagnosis Date  . NAFLD (nonalcoholic fatty liver disease)       ROS:   Denies fever, rhinorrhea  +cough, epigastric pain, rectal pain   Assessment / Plan / Recommendations:   Please see A&P under problem oriented charting for assessment of the patient's acute and chronic medical conditions.   As always, pt is advised that if symptoms worsen or new symptoms arise, they should go to an urgent care facility or to to ER for further evaluation.   Consent and Medical Decision Making:   Patient discussed with Dr. Heide Spark  This is a telephone encounter between Joe Pearson and Joe Pearson on 09/13/2018 for gerd, anal fissure, insomnia. The visit was conducted with the patient located at home and Joe Pearson at [facility]. The patient's identity was confirmed using their DOB and current address. The patient has consented to being evaluated through a telephone encounter and understands the associated risks (an examination cannot be done and the patient may need to come in for an appointment) / benefits (allows the patient to remain at home, decreasing exposure to coronavirus). I personally spent 22 minutes on medical discussion.

## 2018-10-09 ENCOUNTER — Telehealth: Payer: Self-pay

## 2018-10-09 NOTE — Telephone Encounter (Signed)
Pt will be seen at 1315 5/27 by dr Crista Elliot

## 2018-10-09 NOTE — Telephone Encounter (Signed)
Needs in person appt. Pls see if Dr Obie Dredge in [facility] today as has CC openings. Otherwise look for open CC appts tomorrow.

## 2018-10-09 NOTE — Telephone Encounter (Signed)
Pt calls and states, 4 days ago he started having a disch from penis, also it feels"itchy". He states it has all but stopped today but he is concerned. There are no open slots in [facility] for the rest of the week. Please advise

## 2018-10-09 NOTE — Telephone Encounter (Signed)
Requesting to speak with a nurse about something. Please call back.  

## 2018-10-10 ENCOUNTER — Other Ambulatory Visit: Payer: Self-pay

## 2018-10-10 ENCOUNTER — Encounter: Payer: Self-pay | Admitting: Internal Medicine

## 2018-10-10 ENCOUNTER — Ambulatory Visit (INDEPENDENT_AMBULATORY_CARE_PROVIDER_SITE_OTHER): Payer: Self-pay | Admitting: Internal Medicine

## 2018-10-10 ENCOUNTER — Other Ambulatory Visit (HOSPITAL_COMMUNITY)
Admission: RE | Admit: 2018-10-10 | Discharge: 2018-10-10 | Disposition: A | Discharge status: Home / Self Care | Payer: Self-pay | Source: Ambulatory Visit | Attending: Internal Medicine | Admitting: Internal Medicine

## 2018-10-10 VITALS — BP 135/89 | HR 83 | Temp 98.2°F | Wt 246.0 lb

## 2018-10-10 DIAGNOSIS — N342 Other urethritis: Secondary | ICD-10-CM | POA: Insufficient documentation

## 2018-10-10 DIAGNOSIS — K219 Gastro-esophageal reflux disease without esophagitis: Secondary | ICD-10-CM

## 2018-10-10 LAB — POCT URINALYSIS DIPSTICK
Bilirubin, UA: NEGATIVE
Blood, UA: NEGATIVE
Glucose, UA: NEGATIVE
Ketones, UA: NEGATIVE
Leukocytes, UA: NEGATIVE
Nitrite, UA: NEGATIVE
Protein, UA: NEGATIVE
Spec Grav, UA: 1.02 (ref 1.010–1.025)
Urobilinogen, UA: 0.2 E.U./dL
pH, UA: 5.5 (ref 5.0–8.0)

## 2018-10-10 MED ORDER — CEFTRIAXONE SODIUM 250 MG IJ SOLR
250.0000 mg | Freq: Once | INTRAMUSCULAR | Status: AC
  Administered 2018-10-10: 250 mg via INTRAMUSCULAR

## 2018-10-10 MED ORDER — AZITHROMYCIN 500 MG PO TABS: 1000.0000 mg | ORAL_TABLET | Freq: Every day | ORAL | 0 refills | Status: DC

## 2018-10-10 MED ORDER — PANTOPRAZOLE SODIUM 40 MG PO TBEC: 40.0000 mg | DELAYED_RELEASE_TABLET | Freq: Every day | ORAL | 3 refills | Status: DC

## 2018-10-10 NOTE — Assessment & Plan Note (Signed)
  Penile Discharge: Discharge of white colored thick mucus process of 4 days duration associated with pruritis but no particular pain not responding fully to self administered dose of amoxicillin. This occurred 3 days following a first time receiving oral sexual encounter from a male partner. Patient denied fever, chills, nausea, vomiting, abdominal pain, urinary frequency, urinary hesitation, urinary color change, dysuria, genital rash, swollen lymph nodes, rash elsewhere, joint pain, visual changes, cough or dyspnea.   Plan: Obtained a POC dipstick- (- for WBC's or leukocyte esterase) Sending urine for G/C NAAT CTX 250mg  IM x 1 dose Azithromycin 1g PO ordered to [facility] pharmacy Return if symptoms worsen

## 2018-10-10 NOTE — Progress Notes (Signed)
   CC: penile discharge  HPI:[name] is a 48 y.o. male who presents for evaluation of penile discharge. Please see individual problem based A/P for details.   Past Medical History:  Diagnosis Date  . NAFLD (nonalcoholic fatty liver disease)    Review of Systems:   ROS negative except as per HPI.  Physical Exam: Vitals:   10/10/18 1315  BP: 135/89  Pulse: 83  Temp: 98.2 F (36.8 C)  TempSrc: Oral  SpO2: 98%  Weight: 246 lb (111.6 kg)   General: A/O x4, in no acute distress, afebrile, nondiaphoretic Cardio: RRR, no mrg's  Pulmonary: CTA bilaterally, no wheezing or crackles  Abdomen: Bowel sounds normal, soft, nontender  GU: No rash or lymphadenopathy, no penile discharge expressed, normal genitalia  Psych: Appropriate affect, not depressed in appearance, engages well  Assessment & Plan:   See Encounters Tab for problem based charting.  Patient discussed with Dr. Cleda Daub

## 2018-10-10 NOTE — Patient Instructions (Signed)
FOLLOW-UP INSTRUCTIONS When: If your symptoms worsen or fail to improve  Today we discussed your urinary symptoms. I will notify you of the results of any labs from today's evaluation when available to me.   As always if your symptoms worsen, fail to improve, or you develop other concerning symptoms, please notify our office or visit the local ER if we are unavailable. Symptoms including fever, joint pain, rash, should not be ignored and should encourage you to visit the ED if we are unavailable by phone or the symptoms are severe.  Thank you for your visit to the [facility] today. If you have any questions or concerns please call us at [phone].

## 2018-10-10 NOTE — Assessment & Plan Note (Signed)
  Dyspepsia: Months of severe acid reflux symptoms worse with soda consumption, fatty meals, and lying flat. Advised patient to avoid triggering foods and contact his PCP if his symptoms worsen.  Recent his Prescription to [facility] for pantoprazole.

## 2018-10-11 LAB — URINE CYTOLOGY ANCILLARY ONLY
Chlamydia: NEGATIVE
Neisseria Gonorrhea: NEGATIVE

## 2018-10-12 ENCOUNTER — Telehealth: Payer: Self-pay | Admitting: *Deleted

## 2018-10-12 DIAGNOSIS — N342 Other urethritis: Secondary | ICD-10-CM

## 2018-10-12 MED ORDER — AZITHROMYCIN 500 MG PO TABS: 1000.0000 mg | ORAL_TABLET | Freq: Every day | ORAL | 0 refills | Status: DC

## 2018-10-12 NOTE — Telephone Encounter (Signed)
Patient notified of lab tests from 10/10/2018. No therapeutic change. Patient "lost" both of his azithromycin tablets. A refill was sent to the [facility] pharmacy.

## 2018-10-12 NOTE — Telephone Encounter (Signed)
Dr Crista Elliot, pt is returning your call.

## 2018-10-16 NOTE — Progress Notes (Signed)
Internal Medicine Clinic Attending  Case discussed with Dr. Harbrecht at the time of the visit.  We reviewed the resident's history and exam and pertinent patient test results.  I agree with the assessment, diagnosis, and plan of care documented in the resident's note.   

## 2018-11-03 ENCOUNTER — Encounter: Payer: Self-pay | Admitting: *Deleted

## 2019-03-04 ENCOUNTER — Ambulatory Visit: Payer: Self-pay | Admitting: Family Medicine

## 2019-03-05 ENCOUNTER — Encounter: Payer: Self-pay | Admitting: Family Medicine

## 2019-08-11 IMAGING — CT CT ABD-PELV W/ CM
2 of 5 series · 16 of 46 positions shown, 18 images · IV contrast (Omni 300)
Comparison: 12/19/2017

CLINICAL DATA: Low back pain radiating to the abdomen for 3 days.
Left lower quadrant pain. History of appendectomy.

EXAM:
CT ABDOMEN AND PELVIS WITH CONTRAST
TECHNIQUE: Multidetector CT imaging of the abdomen and pelvis was performed
using the standard protocol following bolus administration of
intravenous contrast.
CONTRAST:  100mL KSNQFB-J22 IOPAMIDOL (KSNQFB-J22) INJECTION 61%

[Series 3: a/p w/ 5mm · axial · 0.85mm/px · z∈[+836,+1271]mm · 13 of 99 slices shown, 15 images]
[im 6/99  soft-tissue]
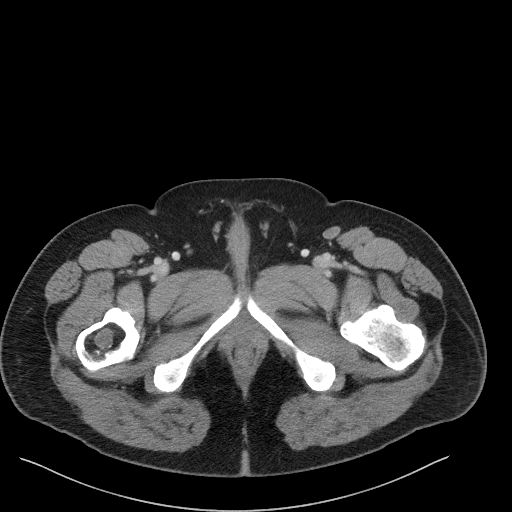
[im 6/99  bone]
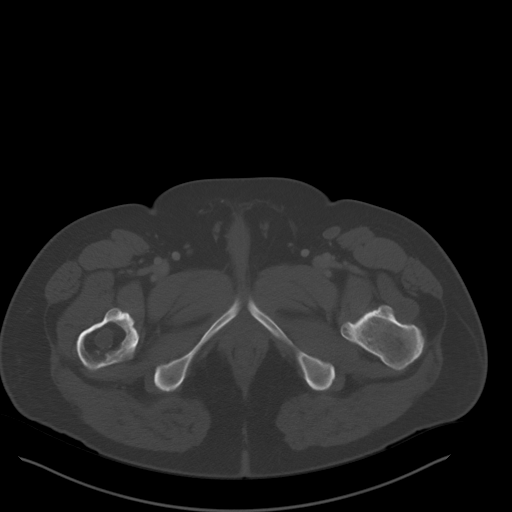
[im 11/99  soft-tissue]
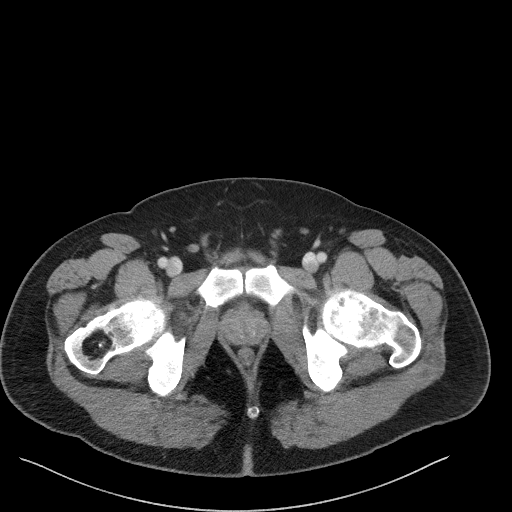
[im 22/99  soft-tissue]
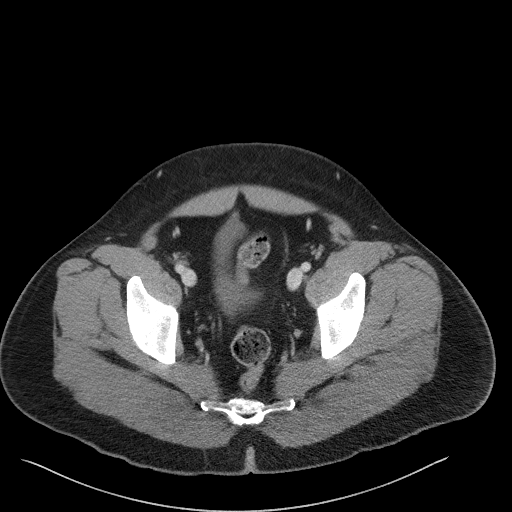
[im 28/99  soft-tissue]
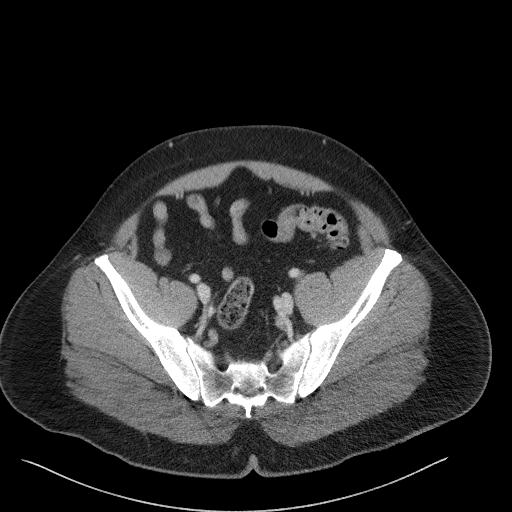
[im 33/99  soft-tissue]
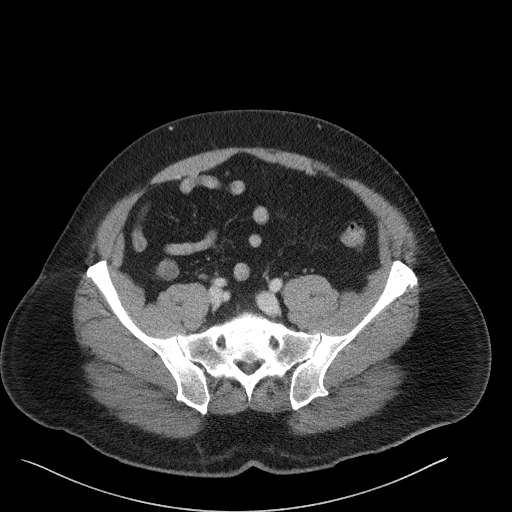
[im 44/99  soft-tissue]
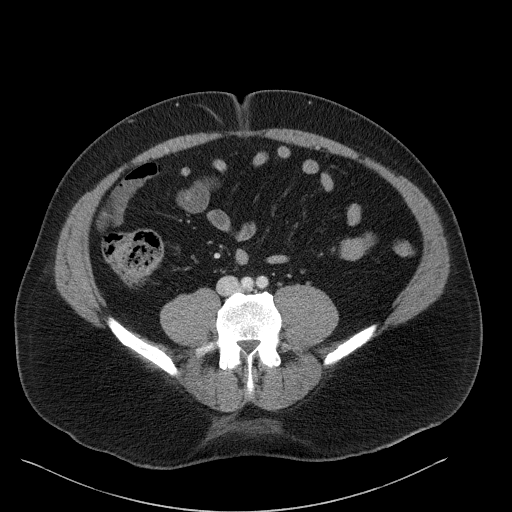
[im 50/99  soft-tissue]
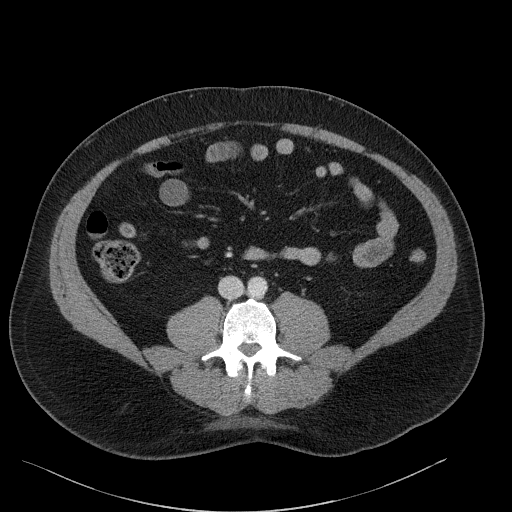
[im 55/99  soft-tissue]
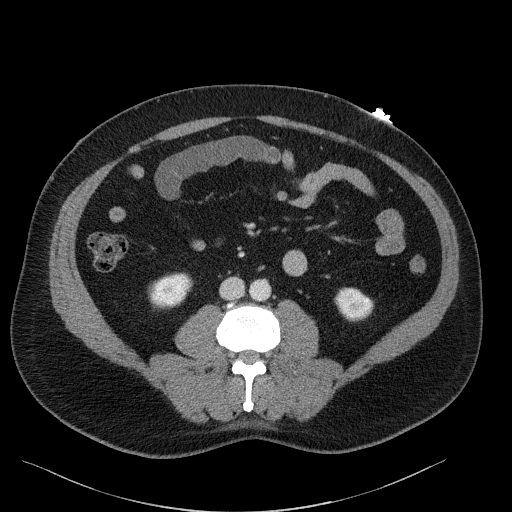
[im 66/99  soft-tissue]
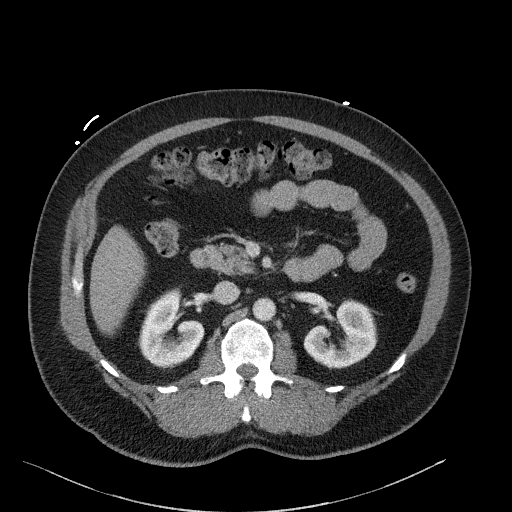
[im 66/99  bone]
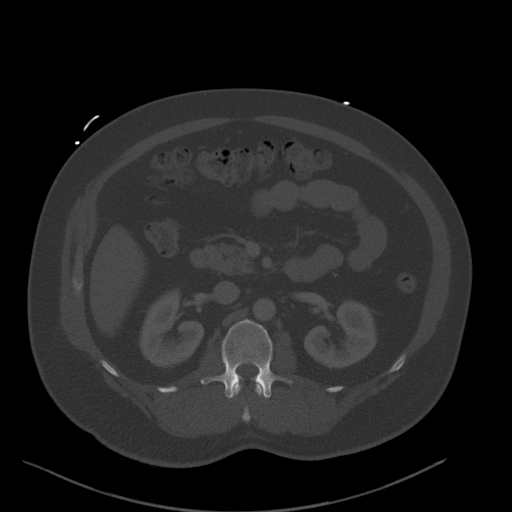
[im 71/99  soft-tissue]
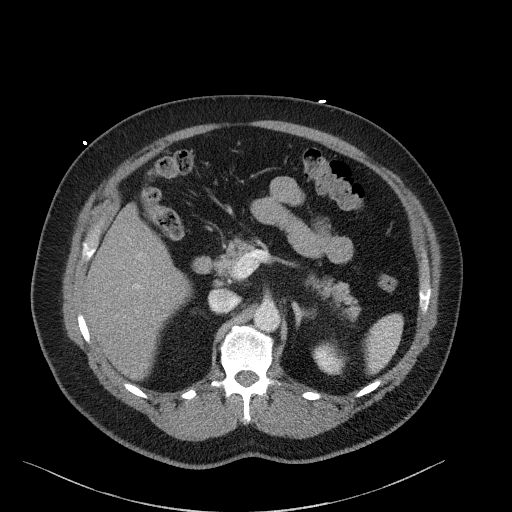
[im 77/99  soft-tissue]
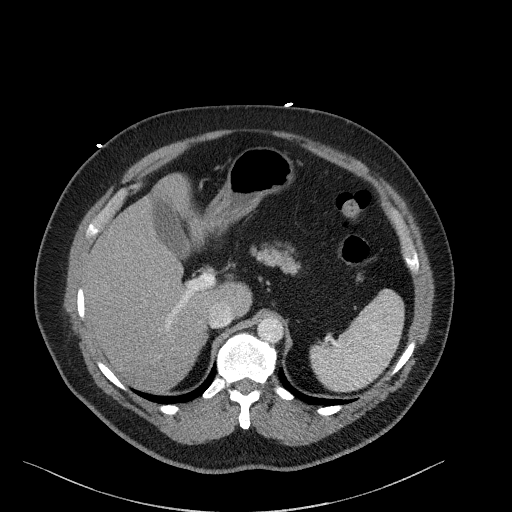
[im 88/99  soft-tissue]
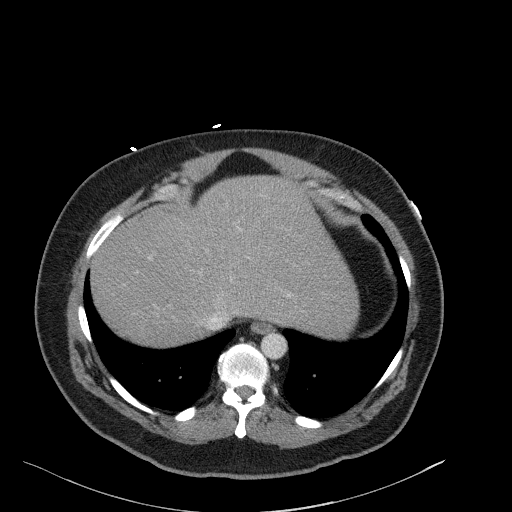
[im 93/99  soft-tissue]
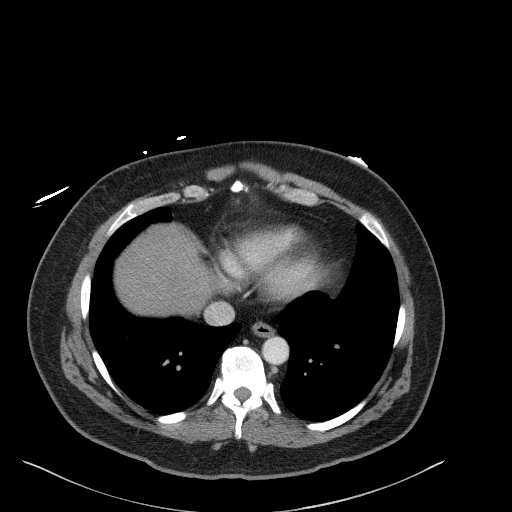

[Series 6: a/p w/ cor · coronal · 0.80mm/px · 3 of 171 slices shown]
[im 57/171  soft-tissue]
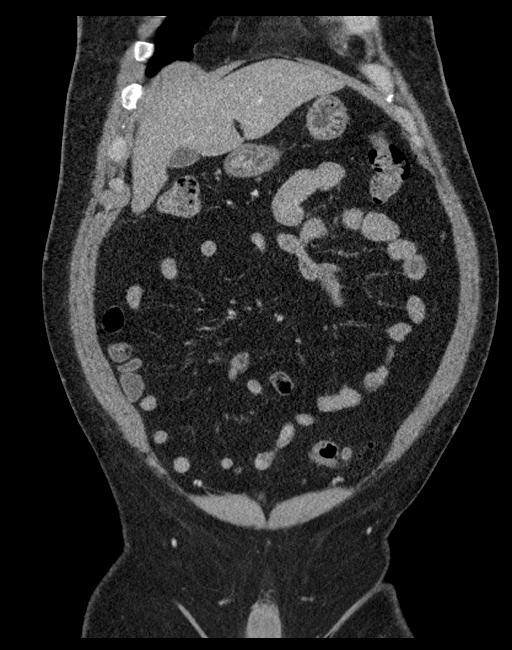
[im 76/171  soft-tissue]
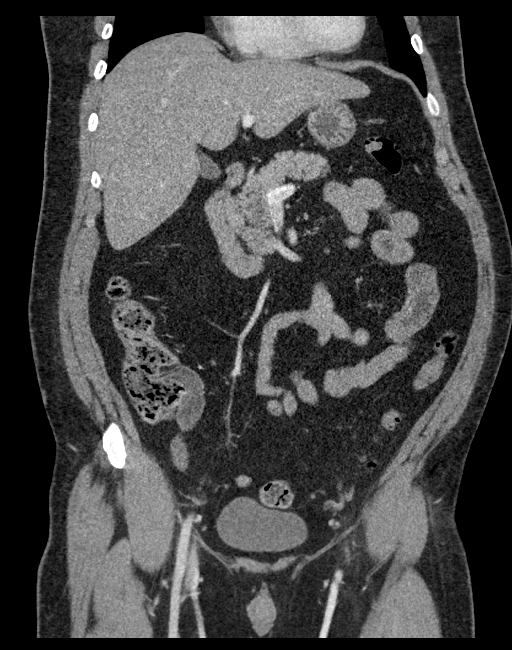
[im 95/171  soft-tissue]
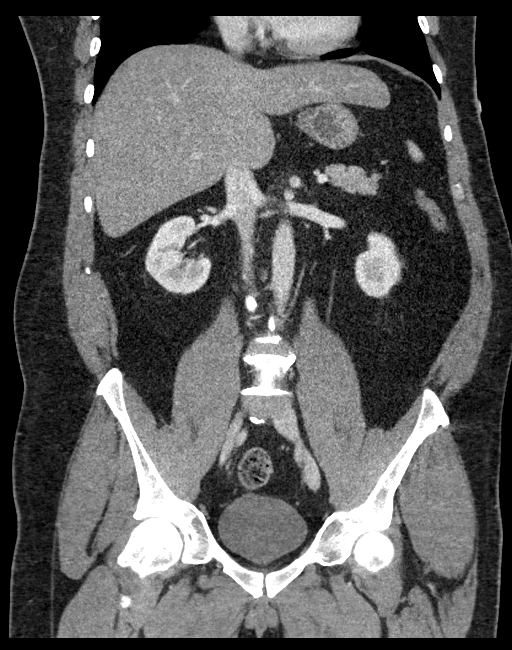

[16 of 46 positions shown; findings below may reference images not displayed]

FINDINGS: Lower chest: Lung bases are clear.

Hepatobiliary: Diffuse fatty infiltration of the liver. No focal
liver abnormality is seen. No gallstones, gallbladder wall
thickening, or biliary dilatation.

Pancreas: Unremarkable. No pancreatic ductal dilatation or
surrounding inflammatory changes.

Spleen: Normal in size without focal abnormality.

Adrenals/Urinary Tract: Adrenal glands are unremarkable. Kidneys are
normal, without renal calculi, focal lesion, or hydronephrosis.
Bladder is unremarkable.

Stomach/Bowel: Stomach, small bowel, and colon are not abnormally
distended. No wall thickening or infiltration is identified. Sigmoid
colonic diverticula without evidence of diverticulitis. Appendix is
surgically absent.

Vascular/Lymphatic: No significant vascular findings are present. No
enlarged abdominal or pelvic lymph nodes.

Reproductive: Prostate is unremarkable.

Other: No abdominal wall hernia or abnormality. No abdominopelvic
ascites.

Musculoskeletal: 7 cm septated cystic lesion in the proximal right
femur with central nidus. Appearance is unchanged since previous
study. No expansile changes or periosteal reaction. Mild
degenerative changes in the spine.
IMPRESSION: No acute process demonstrated in the abdomen or pelvis. No evidence
of bowel obstruction or inflammation. Diffuse fatty infiltration of
the liver. Benign-appearing lucent bone lesion in the right proximal
femur without change since prior study.

## 2019-08-21 ENCOUNTER — Encounter: Payer: Self-pay | Admitting: Internal Medicine

## 2019-08-21 ENCOUNTER — Other Ambulatory Visit: Payer: Self-pay

## 2019-08-21 ENCOUNTER — Ambulatory Visit (INDEPENDENT_AMBULATORY_CARE_PROVIDER_SITE_OTHER): Payer: Self-pay | Admitting: Internal Medicine

## 2019-08-21 VITALS — BP 148/105 | HR 73 | Temp 98.4°F | Wt 251.4 lb

## 2019-08-21 DIAGNOSIS — M62838 Other muscle spasm: Secondary | ICD-10-CM | POA: Insufficient documentation

## 2019-08-21 MED ORDER — CYCLOBENZAPRINE HCL 10 MG PO TABS: 10.0000 mg | ORAL_TABLET | Freq: Three times a day (TID) | ORAL | 0 refills | Status: AC | PRN

## 2019-08-21 MED ORDER — MELOXICAM 15 MG PO TABS: 15.0000 mg | ORAL_TABLET | Freq: Every day | ORAL | 0 refills | Status: AC

## 2019-08-21 NOTE — Progress Notes (Signed)
Internal Medicine Clinic Attending  Case discussed with Dr. Bloomfield at the time of the visit.  We reviewed the resident's history and exam and pertinent patient test results.  I agree with the assessment, diagnosis, and plan of care documented in the resident's note.  

## 2019-08-21 NOTE — Assessment & Plan Note (Signed)
Patient presents with 1 week history of right-sided neck pain that radiates into his shoulder. He does not recall any particular event that triggered the pain, but may have slept on his neck wrong. Pain has gotten worse the last 3 days, making it difficult for him to sleep or use his right arm well. He has tried topical analgesics, ibuprofen, ice/heat without much relief.  On exam, trapezius muscle is quite tender throughout and pressing at insertion point replicates  pain he feels down his arm.   Will treat with 1 week course of Mobic 15 mg daily and Flexeril. Also provided home stretching program to do over the next 2 weeks.   He is overdue for PCP appointment so have recommended that he return in 3-4 weeks to follow-up with PCP and to ensure neck pain is improving.

## 2019-08-21 NOTE — Patient Instructions (Signed)
Joe Pearson, It was nice meeting you. Today we discussed your neck and shoulder pain which is likely from a tight muscle that runs from your neck to your shoulder (the trapezius). I have included some instructions for things you can do at home to help.  I will also send in two prescriptions to take for the next week.   We'll bring you back in 3-4 weeks with Dr. Marchia Bond for your health maintenance screenings and to see how your neck is doing.   1. Check for Tightness Throughout the Day Whether you're sitting at your desk or out for a run, periodically check where your shoulders are. Are they relaxed or are your traps contracted? Are you shrugging your shoulders?  In the ideal state, your shoulders should be in line with your collarbone--not higher or pushed forward. You can easily check this by simply letting your shoulders "fall." You may realize you have them in a shrugged position too often.  2. Do Shoulder Shrugs To prevent your traps from tightening, and to release tension from the traps, do shoulder shrugs regularly throughout the day.  Exaggerate the movement by pulling your shoulders all the way to your ears, holding them there for a few seconds, and then letting them fall to a relaxed position. You can also loosen your traps by rolling your shoulders in both directions.  3. Stretch Here's how to stretch tight traps, or those that may become tight after a long day of work or training:  Positioning: You can do this sitting or standing. Always have your hand on the shoulder you want to stretch to prevent it from moving up. The other hand should be on top of your head with your fingers pointing towards the back. Your neck should always remain inline with your back and the only body part that is moving is your head  Forward stretch: Gently pull your head forward with your chin toward your neck as if you were nodding. Hold this position for 10 to 15 seconds.  Side stretch: Gently pull your head  to the side so your ear approaches the opposite shoulder. Switch sides. Hold this position for 10 to 15 seconds.  Diagonal stretch: Gently pull your head diagonally forward so your chin approaches the opposite shoulder. Hold this position for 10 to 15 seconds.  Repeat these stretches on the other side. Start with the forward stretch, but this time, your hand should be on the opposite shoulder. Go through these stretches 2 to 3 times in one sitting and repeat throughout the day.  4. Get a Massage or "Tennis-Ball" Your Traps If you have the time and resources, get a massage. This will relieve you from tension and make you feel a lot better.  Don't have the budget flexibility for a massage? No problem. You can do it on your own. All you need is a wall and a tennis ball (it can also be any other ball or round durable item).  First, massage yourself by rolling the ball on your traps, with some pressure. You will feel how tight it is and there will likely be one spot that hurts the most. Wherever that spot is, stay there with your tennis ball and slightly push. Hold that for at least 90 seconds or until you feel a release of tension. Repeat this until you feel that they have gotten looser.  If you feel the tightness on the back, more in your middle traps and between your shoulder blades, you won't  need to do yoga exercises to reach there with the tennis ball. Find a wall and lean against it with the tennis ball between and roll up and down. Again, when you find a tough spot, stay there for a while. Repeat the rolling until you feel the tension ease.  Since massages also helpito release toxins back into the blood stream, be sure to drink plenty of water after.  Follow these guidelines to get rid of tight traps faster and return to your daily activities. It'll be like a weight lifted off your shoulders.

## 2019-08-21 NOTE — Progress Notes (Signed)
Acute Office Visit  Subjective:    Patient ID: Joe Pearson, male    DOB: 09-17-1970, 49 y.o.   MRN: [id number]  Chief Complaint  Patient presents with  . Shoulder Pain    right side shoulder pain radiates to neck and arm/hand "pinched nerve"?    HPI Patient is in today for neck and shoulder pain. Please see problem based charting for further details.   Past Medical History:  Diagnosis Date  . NAFLD (nonalcoholic fatty liver disease)     Past Surgical History:  Procedure Laterality Date  . APPENDECTOMY    . 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 Cancer Mother        breast  . Sudden death Neg Hx   . Hyperlipidemia Neg Hx   . Hypertension Neg Hx   . Heart attack Neg Hx   . Diabetes Neg Hx     Social History   Socioeconomic History  . Marital status: Single    Spouse name: Not on file  . Number of children: Not on file  . Years of education: Not on file  . Highest education level: Not on file  Occupational History  . Not on file  Tobacco Use  . Smoking status: Never Smoker  . Smokeless tobacco: Never Used  Substance and Sexual Activity  . Alcohol use: No    Alcohol/week: 0.0 standard drinks  . Drug use: No  . Sexual activity: Not on file  Other Topics Concern  . Not on file  Social History Narrative  . Not on file   Social Determinants of Health   Financial Resource Strain:   . Difficulty of Paying Living Expenses:   Food Insecurity:   . Worried About Programme researcher, broadcasting/film/video in the Last Year:   . Barista in the Last Year:   Transportation Needs:   . Freight forwarder (Medical):   Marland Kitchen Lack of Transportation (Non-Medical):   Physical Activity:   . Days of Exercise per Week:   . Minutes of Exercise per Session:   Stress:   . Feeling of Stress :   Social Connections:   . Frequency of Communication with Friends and Family:   . Frequency of Social Gatherings with Friends and  Family:   . Attends Religious Services:   . Active Member of Clubs or Organizations:   . Attends Banker Meetings:   Marland Kitchen Marital Status:   Intimate Partner Violence:   . Fear of Current or Ex-Partner:   . Emotionally Abused:   Marland Kitchen Physically Abused:   . Sexually Abused:     Outpatient Medications Prior to Visit  Medication Sig Dispense Refill  . azithromycin (ZITHROMAX) 500 MG tablet Take 2 tablets (1,000 mg total) by mouth daily. 2 tablet 0  . Efinaconazole 10 % SOLN Apply 1 application topically daily. 4 mL 1  . lidocaine (XYLOCAINE) 2 % jelly Apply 1 application topically as needed. 30 mL 0  . pantoprazole (PROTONIX) 40 MG tablet Take 1 tablet (40 mg total) by mouth daily. 30 tablet 3   No facility-administered medications prior to visit.    Allergies  Allergen Reactions  . Contrast Media [Iodinated Diagnostic Agents] Hives and Itching    Review of Systems  Constitutional: negative for fevers, chills Skin: negative for rash MSK: negative for other joint or muscle pain Neuro: negative for weakness in upper  extremity      Objective:    Physical Exam Constitutional:      General: He is not in acute distress.    Appearance: Normal appearance.  Musculoskeletal:     Right shoulder: No effusion. Normal range of motion. Normal strength.     Cervical back: Spasms and tenderness present. No bony tenderness. Decreased range of motion.     Comments: Decreased cervical ROM secondary to pain, particularly with rotation and sidebending.  Tenderness along trapezium, worse at insertion.    Skin:    General: Skin is warm and dry.  Neurological:     Mental Status: He is alert.     BP (!) 148/105 (BP Location: Left Arm, Patient Position: Sitting, Cuff Size: Normal)   Pulse 73   Temp 98.4 F (36.9 C) (Oral)   Wt 251 lb 6.4 oz (114 kg)   SpO2 98%   BMI 40.58 kg/m  Wt Readings from Last 3 Encounters:  08/21/19 251 lb 6.4 oz (114 kg)  10/10/18 246 lb (111.6 kg)    03/29/18 235 lb 12.8 oz (107 kg)    Health Maintenance Due  Topic Date Due  . HIV Screening  Never done  . TETANUS/TDAP  Never done  . COLONOSCOPY  Never done    There are no preventive care reminders to display for this patient.   No results found for: TSH Lab Results  Component Value Date   WBC 7.0 12/29/2017   HGB 13.8 12/29/2017   HCT 43.3 12/29/2017   MCV 88.7 12/29/2017   PLT 193 12/29/2017   Lab Results  Component Value Date   NA 144 02/16/2018   K 4.9 02/16/2018   CO2 26 02/16/2018   GLUCOSE 113 (H) 02/16/2018   BUN 20 02/16/2018   CREATININE 1.25 02/16/2018   BILITOT 0.6 12/29/2017   ALKPHOS 56 12/29/2017   AST 18 12/29/2017   ALT 30 12/29/2017   PROT 6.4 (L) 12/29/2017   ALBUMIN 4.0 12/29/2017   CALCIUM 9.4 02/16/2018   ANIONGAP 9 12/29/2017   Lab Results  Component Value Date   CHOL 142 02/16/2018   Lab Results  Component Value Date   HDL 40 02/16/2018   Lab Results  Component Value Date   LDLCALC 90 02/16/2018   Lab Results  Component Value Date   TRIG 59 02/16/2018   Lab Results  Component Value Date   CHOLHDL 3.6 02/16/2018   Lab Results  Component Value Date   HGBA1C 5.6 02/16/2018       Assessment & Plan:   Problem List Items Addressed This Visit      Musculoskeletal and Integument   Trapezius muscle spasm - Primary    Patient presents with 1 week history of right-sided neck pain that radiates into his shoulder. He does not recall any particular event that triggered the pain, but may have slept on his neck wrong. Pain has gotten worse the last 3 days, making it difficult for him to sleep or use his right arm well. He has tried topical analgesics, ibuprofen, ice/heat without much relief.  On exam, trapezius muscle is quite tender throughout and pressing at insertion point replicates  pain he feels down his arm.   Will treat with 1 week course of Mobic 15 mg daily and Flexeril. Also provided home stretching program to do over  the next 2 weeks.   He is overdue for PCP appointment so have recommended that he return in 3-4 weeks to follow-up with PCP and  to ensure neck pain is improving.           Meds ordered this encounter  Medications  . meloxicam (MOBIC) 15 MG tablet    Sig: Take 1 tablet (15 mg total) by mouth daily for 7 days.    Dispense:  7 tablet    Refill:  0  . cyclobenzaprine (FLEXERIL) 10 MG tablet    Sig: Take 1 tablet (10 mg total) by mouth 3 (three) times daily as needed for up to 7 days for muscle spasms.    Dispense:  21 tablet    Refill:  0     Adele Milson D Jobina Maita, DO

## 2020-02-05 ENCOUNTER — Ambulatory Visit (INDEPENDENT_AMBULATORY_CARE_PROVIDER_SITE_OTHER): Payer: Self-pay | Admitting: Student

## 2020-02-05 ENCOUNTER — Other Ambulatory Visit: Payer: Self-pay

## 2020-02-05 ENCOUNTER — Encounter: Payer: Self-pay | Admitting: Student

## 2020-02-05 VITALS — BP 140/93 | HR 80 | Temp 98.5°F | Ht 66.0 in | Wt 242.0 lb

## 2020-02-05 DIAGNOSIS — E1165 Type 2 diabetes mellitus with hyperglycemia: Secondary | ICD-10-CM

## 2020-02-05 DIAGNOSIS — E119 Type 2 diabetes mellitus without complications: Secondary | ICD-10-CM | POA: Insufficient documentation

## 2020-02-05 DIAGNOSIS — Z2883 Immunization not carried out due to unavailability of vaccine: Secondary | ICD-10-CM

## 2020-02-05 DIAGNOSIS — Z23 Encounter for immunization: Secondary | ICD-10-CM | POA: Insufficient documentation

## 2020-02-05 DIAGNOSIS — I10 Essential (primary) hypertension: Secondary | ICD-10-CM

## 2020-02-05 HISTORY — DX: Immunization not carried out due to unavailability of vaccine: Z28.83

## 2020-02-05 LAB — GLUCOSE, CAPILLARY: Glucose-Capillary: 229 mg/dL — ABNORMAL HIGH (ref 70–99)

## 2020-02-05 MED ORDER — LISINOPRIL 10 MG PO TABS: 10.0000 mg | ORAL_TABLET | Freq: Every day | ORAL | 11 refills | Status: DC

## 2020-02-05 MED ORDER — METFORMIN HCL 500 MG PO TABS: 500.0000 mg | ORAL_TABLET | Freq: Two times a day (BID) | ORAL | 0 refills | Status: DC

## 2020-02-05 NOTE — Assessment & Plan Note (Signed)
Patient requesting information regarding the COVID vaccination today in clinic. He was informed that he can obtain this vaccination at any pharmacy which he plans to do as soon as possible. -Follow-up on confirmation of COVID vaccination

## 2020-02-05 NOTE — Assessment & Plan Note (Addendum)
Joe Pearson presented to the emergency department on 01/24/20 with complaints of polyuria, polydipsia, and dry mouth. His glucose at the time was 553 and HbA1c was 10.1. He was diagnosed with diabetes and started on metformin 500mg  twice daily. Since this emergency department visit, he reports significantly improving his diet and cutting his soda intake which was previously significant. He is interested in learning more about diet modifications and exercise. He has been adherent to the prescribed metformin 500mg  twice daily, however he has had multiple episodes of diarrhea overnight since starting the medication. -Discussed lifestyle modifications and printed off information for patient -Continue metformin 500mg  twice daily in setting of GI upset with plan to increase as GI symptoms resolve -Patient will need diabetic foot exam and ophthalmology evaluation, however he is self pay and is working to get orange card for referral -Obtain urine microalbumin/creatinine ratio today

## 2020-02-05 NOTE — Assessment & Plan Note (Signed)
Patient declined influenza vaccination today. -Continue to counsel

## 2020-02-05 NOTE — Patient Instructions (Addendum)
Dear [name],  It was a pleasure meeting you in clinic today.  For your diabetes, we will continue your current dose of metformin 500mg  twice daily until your gastrointestinal symptoms resolve. We may increase this medication at your follow-up visit.  For your high blood pressure, we will start a low dose medication called lisinopril which will help lower your blood pressure. You can pick this medication up at the pharmacy.  Sincerely, , MD   Diabetes Mellitus and Nutrition, Adult When you have diabetes (diabetes mellitus), it is very important to have healthy eating habits because your blood sugar (glucose) levels are greatly affected by what you eat and drink. Eating healthy foods in the appropriate amounts, at about the same times every day, can help you:  Control your blood glucose.  Lower your risk of heart disease.  Improve your blood pressure.  Reach or maintain a healthy weight. Every person with diabetes is different, and each person has different needs for a meal plan. Your health care provider may recommend that you work with a diet and nutrition specialist (dietitian) to make a meal plan that is best for you. Your meal plan may vary depending on factors such as:  The calories you need.  The medicines you take.  Your weight.  Your blood glucose, blood pressure, and cholesterol levels.  Your activity level.  Other health conditions you have, such as heart or kidney disease. How do carbohydrates affect me? Carbohydrates, also called carbs, affect your blood glucose level more than any other type of food. Eating carbs naturally raises the amount of glucose in your blood. Carb counting is a method for keeping track of how many carbs you eat. Counting carbs is important to keep your blood glucose at a healthy level, especially if you use insulin or take certain oral diabetes medicines. It is important to know how many carbs you can safely have in each  meal. This is different for every person. Your dietitian can help you calculate how many carbs you should have at each meal and for each snack. Foods that contain carbs include:  Bread, cereal, rice, pasta, and crackers.  Potatoes and corn.  Peas, beans, and lentils.  Milk and yogurt.  Fruit and juice.  Desserts, such as cakes, cookies, ice cream, and candy. How does alcohol affect me? Alcohol can cause a sudden decrease in blood glucose (hypoglycemia), especially if you use insulin or take certain oral diabetes medicines. Hypoglycemia can be a life-threatening condition. Symptoms of hypoglycemia (sleepiness, dizziness, and confusion) are similar to symptoms of having too much alcohol. If your health care provider says that alcohol is safe for you, follow these guidelines:  Limit alcohol intake to no more than 1 drink per day for nonpregnant women and 2 drinks per day for men. One drink equals 12 oz of beer, 5 oz of wine, or 1 oz of hard liquor.  Do not drink on an empty stomach.  Keep yourself hydrated with water, diet soda, or unsweetened iced tea.  Keep in mind that regular soda, juice, and other mixers may contain a lot of sugar and must be counted as carbs. What are tips for following this plan?  Reading food labels  Start by checking the serving size on the "Nutrition Facts" label of packaged foods and drinks. The amount of calories, carbs, fats, and other nutrients listed on the label is based on one serving of the item. Many items contain more than one serving per package.  Check the total grams (g) of carbs in one serving. You can calculate the number of servings of carbs in one serving by dividing the total carbs by 15. For example, if a food has 30 g of total carbs, it would be equal to 2 servings of carbs.  Check the number of grams (g) of saturated and trans fats in one serving. Choose foods that have low or no amount of these fats.  Check the number of milligrams  (mg) of salt (sodium) in one serving. Most people should limit total sodium intake to less than 2,300 mg per day.  Always check the nutrition information of foods labeled as "low-fat" or "nonfat". These foods may be higher in added sugar or refined carbs and should be avoided.  Talk to your dietitian to identify your daily goals for nutrients listed on the label. Shopping  Avoid buying canned, premade, or processed foods. These foods tend to be high in fat, sodium, and added sugar.  Shop around the outside edge of the grocery store. This includes fresh fruits and vegetables, bulk grains, fresh meats, and fresh dairy. Cooking  Use low-heat cooking methods, such as baking, instead of high-heat cooking methods like deep frying.  Cook using healthy oils, such as olive, canola, or sunflower oil.  Avoid cooking with butter, cream, or high-fat meats. Meal planning  Eat meals and snacks regularly, preferably at the same times every day. Avoid going long periods of time without eating.  Eat foods high in fiber, such as fresh fruits, vegetables, beans, and whole grains. Talk to your dietitian about how many servings of carbs you can eat at each meal.  Eat 4-6 ounces (oz) of lean protein each day, such as lean meat, chicken, fish, eggs, or tofu. One oz of lean protein is equal to: ? 1 oz of meat, chicken, or fish. ? 1 egg. ?  cup of tofu.  Eat some foods each day that contain healthy fats, such as avocado, nuts, seeds, and fish. Lifestyle  Check your blood glucose regularly.  Exercise regularly as told by your health care provider. This may include: ? 150 minutes of moderate-intensity or vigorous-intensity exercise each week. This could be brisk walking, biking, or water aerobics. ? Stretching and doing strength exercises, such as yoga or weightlifting, at least 2 times a week.  Take medicines as told by your health care provider.  Do not use any products that contain nicotine or  tobacco, such as cigarettes and e-cigarettes. If you need help quitting, ask your health care provider.  Work with a Veterinary surgeon or diabetes educator to identify strategies to manage stress and any emotional and social challenges. Questions to ask a health care provider  Do I need to meet with a diabetes educator?  Do I need to meet with a dietitian?  What number can I call if I have questions?  When are the best times to check my blood glucose? Where to find more information:  American Diabetes Association: diabetes.org  Academy of Nutrition and Dietetics: www.eatright.[employer] of Diabetes and Digestive and Kidney Diseases (NIH): CarFlippers.tn Summary  A healthy meal plan will help you control your blood glucose and maintain a healthy lifestyle.  Working with a diet and nutrition specialist (dietitian) can help you make a meal plan that is best for you.  Keep in mind that carbohydrates (carbs) and alcohol have immediate effects on your blood glucose levels. It is important to count carbs and to use alcohol carefully. This  information is not intended to replace advice given to you by your health care provider. Make sure you discuss any questions you have with your health care provider. Document Revised: 04/14/2017 Document Reviewed: 06/06/2016 Elsevier Patient Education  2020 [employer].

## 2020-02-05 NOTE — Progress Notes (Addendum)
   CC: Diabetes follow-up  HPI:  Joe Pearson is a 49 y.o. with past medical history of obesity, CKD2, GERD, and nonalcoholic liver disease who presents to clinic for follow-up of newly diagnosed type 2 diabetes. Please, refer to problem list for Assessment & Plan based charting of this encounter.  Past Medical History:  Diagnosis Date  . NAFLD (nonalcoholic fatty liver disease)    Review of Systems:  Review of Systems  Constitutional: Negative for chills and fever.  Respiratory: Negative for cough and shortness of breath.   Cardiovascular: Negative for chest pain and palpitations.  Gastrointestinal: Positive for diarrhea. Negative for nausea and vomiting.  Genitourinary: Negative for frequency and urgency.  Neurological: Negative for dizziness and headaches.   Physical Exam:  Vitals:   02/05/20 0857  BP: (!) 140/93  Pulse: 80  Temp: 98.5 F (36.9 C)  TempSrc: Oral  SpO2: 98%  Weight: 242 lb (109.8 kg)  Height: 5\' 6"  (1.676 m)   Physical Exam Constitutional:      Appearance: Normal appearance.  HENT:     Head: Normocephalic and atraumatic.  Eyes:     Extraocular Movements: Extraocular movements intact.     Conjunctiva/sclera: Conjunctivae normal.  Cardiovascular:     Rate and Rhythm: Normal rate and regular rhythm.     Pulses: Normal pulses.     Heart sounds: Normal heart sounds.  Pulmonary:     Effort: Pulmonary effort is normal.     Breath sounds: Normal breath sounds.  Abdominal:     General: Abdomen is flat. Bowel sounds are normal.     Palpations: Abdomen is soft.     Tenderness: There is no abdominal tenderness.  Musculoskeletal:        General: Normal range of motion.     Cervical back: Normal range of motion and neck supple.  Skin:    General: Skin is warm and dry.     Capillary Refill: Capillary refill takes less than 2 seconds.  Neurological:     General: No focal deficit present.     Mental Status: He is alert. Mental status is at baseline.   Psychiatric:        Mood and Affect: Mood normal.        Behavior: Behavior normal.        Thought Content: Thought content normal.        Judgment: Judgment normal.    Assessment & Plan:   See Encounters Tab for problem based charting.  Patient seen with Dr.  Internal Medicine Clinic Attending  I saw and evaluated patient with Dr. Mayford Knife, reviewed case and data, and agree with Dr. Laural Benes documentation.

## 2020-02-05 NOTE — Assessment & Plan Note (Signed)
Joe Pearson reports having no prior history of hypertension. He denies chest pain, shortness of breath, headache, dizziness, lightheadedness. Per chart review, patient has had elevated blood pressure to the 160s systolic on multiple presentations to the emergency department. Blood pressure today in clinic is 140/93. Given his persistently elevated blood pressure on multiple occassions it is appropriate to start an antihypertensive in clinic today. Recent CMP from 01/24/20 revealed creatinine 1.30 and GFR 63. -Lisinopril 10mg  daily

## 2020-02-06 ENCOUNTER — Telehealth: Payer: Self-pay | Admitting: Internal Medicine

## 2020-02-06 LAB — MICROALBUMIN / CREATININE URINE RATIO
Creatinine, Urine: 177.5 mg/dL
Microalb/Creat Ratio: 3 mg/g creat (ref 0–29)
Microalbumin, Urine: 4.7 ug/mL

## 2020-02-06 NOTE — Telephone Encounter (Signed)
Attempted to contact patient by telephone at 12:00PM and 5:15PM without success and unable to leave voicemail as not set up. Will attempt to call patient again to inform him that his labs from his last appointment are unremarkable.

## 2020-02-06 NOTE — Telephone Encounter (Signed)
Patient requesting a call back about his Lab Results.

## 2020-02-06 NOTE — Telephone Encounter (Signed)
Successfully contacted patient on third attempt and informed of lab results. Thanks!

## 2020-02-13 NOTE — Progress Notes (Signed)
DOS 02/05/20:  Internal Medicine Clinic Attending  I saw and evaluated the patient.  I personally confirmed the key portions of the history and exam documented by Dr. Laural Benes and I reviewed pertinent patient test results.  The assessment, diagnosis, and plan were formulated together and I agree with the documentation in the resident's note.

## 2020-02-17 ENCOUNTER — Ambulatory Visit: Payer: Self-pay

## 2020-03-04 ENCOUNTER — Ambulatory Visit: Payer: Self-pay | Admitting: Internal Medicine

## 2020-03-04 VITALS — BP 140/86 | HR 78 | Temp 98.6°F | Ht 66.0 in | Wt 235.7 lb

## 2020-03-04 DIAGNOSIS — Z7984 Long term (current) use of oral hypoglycemic drugs: Secondary | ICD-10-CM

## 2020-03-04 DIAGNOSIS — E1165 Type 2 diabetes mellitus with hyperglycemia: Secondary | ICD-10-CM

## 2020-03-04 DIAGNOSIS — I10 Essential (primary) hypertension: Secondary | ICD-10-CM

## 2020-03-04 NOTE — Patient Instructions (Signed)
Thank you, Joe Pearson for allowing [place] to provide your care today. Today we discussed blood pressure and diabetes .    I have ordered the following labs for you:  Lab Orders  No laboratory test(s) ordered today     Tests ordered today:  none  Referrals ordered today:   Referral Orders  No referral(s) requested today     I have ordered the following medication/changed the following medications:   Stop the following medications: There are no discontinued medications.   Start the following medications: 1. Please start taking lisinopril 10 mg daily 2. Decrease Metformin to 500 mg daily    Follow up: 2 weeks to check your blood pressure and repeat labs    Remember:   Should you have any questions or concerns please call the internal medicine clinic at [phone].     Dellia Cloud, D.O. [facility]

## 2020-03-04 NOTE — Progress Notes (Signed)
CC: HTN  HPI:  Mr.Bush Judie Petit Carithers is a 49 y.o. male with a past medical history stated below and presents today for HTN. Please see problem based assessment and plan for additional details.  Past Medical History:  Diagnosis Date  . NAFLD (nonalcoholic fatty liver disease)     Current Outpatient Medications on File Prior to Visit  Medication Sig Dispense Refill  . lisinopril (ZESTRIL) 10 MG tablet Take 1 tablet (10 mg total) by mouth daily. 30 tablet 11  . metFORMIN (GLUCOPHAGE) 500 MG tablet Take 1 tablet (500 mg total) by mouth 2 (two) times daily with a meal. 60 tablet 0  . pantoprazole (PROTONIX) 40 MG tablet Take 1 tablet (40 mg total) by mouth daily. 30 tablet 3   No current facility-administered medications on file prior to visit.    Family History  Problem Relation Age of Onset  . Cancer Mother        breast  . Sudden death Neg Hx   . Hyperlipidemia Neg Hx   . Hypertension Neg Hx   . Heart attack Neg Hx   . Diabetes Neg Hx     Social History   Socioeconomic History  . Marital status: Single    Spouse name: Not on file  . Number of children: Not on file  . Years of education: Not on file  . Highest education level: Not on file  Occupational History  . Not on file  Tobacco Use  . Smoking status: Never Smoker  . Smokeless tobacco: Never Used  Substance and Sexual Activity  . Alcohol use: No    Alcohol/week: 0.0 standard drinks  . Drug use: No  . Sexual activity: Not on file  Other Topics Concern  . Not on file  Social History Narrative  . Not on file   Social Determinants of Health   Financial Resource Strain:   . Difficulty of Paying Living Expenses: Not on file  Food Insecurity:   . Worried About Programme researcher, broadcasting/film/video in the Last Year: Not on file  . Ran Out of Food in the Last Year: Not on file  Transportation Needs:   . Lack of Transportation (Medical): Not on file  . Lack of Transportation (Non-Medical): Not on file  Physical Activity:   .  Days of Exercise per Week: Not on file  . Minutes of Exercise per Session: Not on file  Stress:   . Feeling of Stress : Not on file  Social Connections:   . Frequency of Communication with Friends and Family: Not on file  . Frequency of Social Gatherings with Friends and Family: Not on file  . Attends Religious Services: Not on file  . Active Member of Clubs or Organizations: Not on file  . Attends Banker Meetings: Not on file  . Marital Status: Not on file  Intimate Partner Violence:   . Fear of Current or Ex-Partner: Not on file  . Emotionally Abused: Not on file  . Physically Abused: Not on file  . Sexually Abused: Not on file    Review of Systems: ROS negative except for what is noted on the assessment and plan.  Vitals:   03/04/20 1525  BP: 140/86  Pulse: 78  Temp: 98.6 F (37 C)  SpO2: 98%  Weight: 235 lb 11.2 oz (106.9 kg)  Height: 5\' 6"  (1.676 m)     Physical Exam: Physical Exam Constitutional:      Appearance: Normal appearance.  HENT:  Head: Normocephalic and atraumatic.  Eyes:     Extraocular Movements: Extraocular movements intact.  Cardiovascular:     Rate and Rhythm: Normal rate.     Pulses: Normal pulses.     Heart sounds: Normal heart sounds.  Pulmonary:     Effort: Pulmonary effort is normal.     Breath sounds: Normal breath sounds.  Musculoskeletal:        General: Normal range of motion.     Cervical back: Normal range of motion.     Right lower leg: No edema.     Left lower leg: No edema.  Skin:    General: Skin is warm and dry.  Neurological:     Mental Status: He is alert and oriented to person, place, and time. Mental status is at baseline.  Psychiatric:        Mood and Affect: Mood normal.      Assessment & Plan:   See Encounters Tab for problem based charting.  Patient discussed with Dr. Janeece Agee, D.O. [facility], PGY-2 Pager: [phone], Phone: [phone] Date  03/06/2020 Time 7:06 AM

## 2020-03-06 ENCOUNTER — Encounter: Payer: Self-pay | Admitting: Internal Medicine

## 2020-03-06 NOTE — Assessment & Plan Note (Signed)
HistoryPatient presents for follow-up appointment for hypertension started on lisinopril 10 mg daily.  His blood pressure is 140/86 today.  Patient states that he has not been adherent to his lisinopril medication.  Furthermore he states that he has not picked medication up yet.  Counseled him on the importance of taking this medication.  Repeat BMP for kidney function and electrolytes.

## 2020-03-06 NOTE — Assessment & Plan Note (Signed)
Patient presents for follow-up appointment for diabetes last A1c was 5.6.  Diabetic foot exam performed today but states that he is never had a diabetic eye exam.  I counseled him on the importance of diabetic eye exam.  He states he checks his blood sugar daily and it runs from 109 - 112.  Patient states that he has significant loose stools with Metformin 500 mg twice daily.  Considering his hemoglobin A1c was 5.6 or decreased his dose to 500 mg daily.

## 2020-03-09 NOTE — Progress Notes (Signed)
Internal Medicine Clinic Attending  Case discussed with Dr. Coe  At the time of the visit.  We reviewed the resident's history and exam and pertinent patient test results.  I agree with the assessment, diagnosis, and plan of care documented in the resident's note.  

## 2020-03-18 ENCOUNTER — Other Ambulatory Visit: Payer: Self-pay

## 2020-03-18 ENCOUNTER — Ambulatory Visit: Payer: Self-pay | Admitting: Internal Medicine

## 2020-03-18 VITALS — Ht 66.0 in | Wt 231.1 lb

## 2020-03-18 DIAGNOSIS — R109 Unspecified abdominal pain: Secondary | ICD-10-CM | POA: Insufficient documentation

## 2020-03-18 DIAGNOSIS — R1032 Left lower quadrant pain: Secondary | ICD-10-CM

## 2020-03-18 DIAGNOSIS — Z0000 Encounter for general adult medical examination without abnormal findings: Secondary | ICD-10-CM

## 2020-03-18 DIAGNOSIS — I10 Essential (primary) hypertension: Secondary | ICD-10-CM

## 2020-03-18 DIAGNOSIS — E1165 Type 2 diabetes mellitus with hyperglycemia: Secondary | ICD-10-CM

## 2020-03-18 LAB — GLUCOSE, CAPILLARY: Glucose-Capillary: 112 mg/dL — ABNORMAL HIGH (ref 70–99)

## 2020-03-18 LAB — POCT GLYCOSYLATED HEMOGLOBIN (HGB A1C): Hemoglobin A1C: 7.7 % — AB (ref 4.0–5.6)

## 2020-03-18 MED ORDER — METFORMIN HCL ER 500 MG PO TB24: ORAL_TABLET | ORAL | 1 refills | Status: DC

## 2020-03-18 NOTE — Assessment & Plan Note (Signed)
Declined flu vaccine.  Has not gotten the Covid vaccine however states he plans to get it, provided information on [place] vaccination sites.

## 2020-03-18 NOTE — Assessment & Plan Note (Signed)
Patient reports that he has not taken his lisinopril for about a month now.  He has been trying to improve his diet and eating healthier, decreased soda, bread, and rice intake.  Blood pressure today is well controlled at 130/77 off all medications.  We will hold off on resuming any medications at this time.

## 2020-03-18 NOTE — Assessment & Plan Note (Signed)
Patient is currently prescribed Metformin 500 mg twice daily however states that he started having issues with constipation which he felt was related to the Metformin, he decreased his dose to 500 mg daily and is actually not taken it for the past 3 days.  He reports significantly improving his diet, stopped drinking sodas, stopped eating bread and rice, and increased his vegetable intake.  He does not check his blood sugars at home, he did check it once and it was 180.  He denies any polyuria, polydipsia, nausea, vomiting, chest pain, shortness of breath, or other symptoms.  Denies any numbness or tingling and has in his feet. A1c today is mildly elevated at 7.7.  CBGs 112.  Diabetes is currently above goal.  Discussed that Metformin does not commonly cause constipation, however does cause diarrhea and occasionally cause abdominal discomfort.  Discussed switching to a longer acting Metformin to see if this decreases the side effects.  Also discussed need for yearly eye exams, yearly urine protein creatinine ratios, frequent A1c checks, and foot exams.  -Switched to Metformin XR, 500 mg daily for 1 week, then increase to 1000 mg daily -Advised to monitor blood sugars -Encouraged to continue dietary changes -Placed referral for ophthalmology for eye exam -RTC in 3 months

## 2020-03-18 NOTE — Assessment & Plan Note (Signed)
Patient reports having some left-sided abdominal pain for the past 2 to 3 months, described as a sharp pain, last for about 30 to 40 minutes, nothing seems to improve or worsen it.  He does not notice any changes with bowel movements.  Reports having occasional constipation that he feels is related to his metformin use, last bowel movement was today.  Denies any nausea, vomiting, fevers, chills, diarrhea, or other symptoms.  On exam he has no tenderness to palpation, no acute abdomen noted. This could be related to abdominal discomfort related to his Metformin.  Vitals are stable at this time.  Discussed switching his Metformin to a longer acting Metformin to see if this will help.  Also encouraged increasing fluid intake and fiber intake.

## 2020-03-18 NOTE — Patient Instructions (Addendum)
Joe Pearson,  It was a pleasure to see you today. Thank you for coming in.   Today we discussed your diabetes. The metformin can cause abdominal discomfort and diarrhea. You can switch from the short acting metformin to a longer acting metformin that can have a lower risk of side effects. You can start with 500 mg daily for 1 week then increase to 1000 mg daily after that.  I have referred you to the eye doctor for an eye exam.  We also discussed your blood pressure, your blood pressure looks good today. You can stop taking the lisinopril.   Please return to clinic in 3 months or sooner if needed.   Thank you again for coming in.   [name].D.

## 2020-03-18 NOTE — Progress Notes (Signed)
   CC: DM, HTN, abdominal discomfort  HPI:  Joe Pearson is a 49 y.o. with a history of recently diagnosed diabetes, and hypertension presenting for diabetes, hypertension, and abdominal pain.  Past Medical History:  Diagnosis Date  . NAFLD (nonalcoholic fatty liver disease)    Review of Systems:   Constitutional: Negative for chills and fever.  Respiratory: Negative for shortness of breath.   Cardiovascular: Negative for chest pain and leg swelling.  Gastrointestinal: Positive for abdominal pain. Negative for nausea and vomiting.  Neurological: Negative for dizziness and headaches.   Physical Exam:  Vitals:   03/18/20 1534  SpO2: 99%  Weight: 231 lb 1.6 oz (104.8 kg)  Height: 5\' 6"  (1.676 m)   Physical Exam Constitutional:      Appearance: He is obese.  HENT:     Head: Normocephalic and atraumatic.  Eyes:     Extraocular Movements: Extraocular movements intact.     Pupils: Pupils are equal, round, and reactive to light.  Cardiovascular:     Rate and Rhythm: Normal rate and regular rhythm.     Heart sounds: Normal heart sounds.  Pulmonary:     Effort: Pulmonary effort is normal.     Breath sounds: Normal breath sounds.  Abdominal:     General: Abdomen is flat. Bowel sounds are normal. There is no distension.     Palpations: Abdomen is soft.     Tenderness: There is no abdominal tenderness.  Genitourinary:    Testes:        Left: Mass:    Skin:    General: Skin is warm and dry.     Capillary Refill: Capillary refill takes less than 2 seconds.  Neurological:     General: No focal deficit present.     Mental Status: He is alert and oriented to person, place, and time.  Psychiatric:        Mood and Affect: Mood normal.        Behavior: Behavior normal.     Assessment & Plan:   See Encounters Tab for problem based charting.  Patient discussed with Dr. 

## 2020-03-20 ENCOUNTER — Encounter: Payer: Self-pay | Admitting: *Deleted

## 2020-03-22 NOTE — Progress Notes (Signed)
Internal Medicine Clinic Attending ° °Case discussed with Dr. Krienke  At the time of the visit.  We reviewed the resident’s history and exam and pertinent patient test results.  I agree with the assessment, diagnosis, and plan of care documented in the resident’s note.  °

## 2020-04-21 NOTE — Addendum Note (Signed)
Addended by: Neomia Dear on: 04/21/2020 09:59 AM   Modules accepted: Orders

## 2020-05-28 ENCOUNTER — Encounter: Payer: Self-pay | Admitting: *Deleted

## 2020-06-11 ENCOUNTER — Other Ambulatory Visit: Payer: Self-pay

## 2020-06-11 ENCOUNTER — Ambulatory Visit: Payer: Self-pay | Admitting: Internal Medicine

## 2020-06-11 DIAGNOSIS — E1165 Type 2 diabetes mellitus with hyperglycemia: Secondary | ICD-10-CM

## 2020-06-11 MED ORDER — METFORMIN HCL ER 500 MG PO TB24: ORAL_TABLET | ORAL | 1 refills | Status: DC

## 2020-06-12 ENCOUNTER — Encounter: Payer: Self-pay | Admitting: Internal Medicine

## 2020-06-14 ENCOUNTER — Encounter: Payer: Self-pay | Admitting: Internal Medicine

## 2020-06-14 NOTE — Progress Notes (Signed)
Called patient for telehealth visit. Patient stated that he would prefer to reschedule for an in person appointment. I will get the front desk staff to reschedule his appointment.    Chari Manning, D.O.  Internal Medicine Resident, PGY-2 [facility]  Pager: [phone]

## 2020-06-16 NOTE — Progress Notes (Signed)
Internal Medicine Clinic Attending  Case discussed with Dr. Marchia Bond

## 2020-07-02 ENCOUNTER — Encounter: Payer: Self-pay | Admitting: Student

## 2020-07-02 ENCOUNTER — Ambulatory Visit (INDEPENDENT_AMBULATORY_CARE_PROVIDER_SITE_OTHER): Payer: Self-pay | Admitting: Student

## 2020-07-02 VITALS — BP 140/75 | HR 73 | Temp 98.0°F | Wt 231.0 lb

## 2020-07-02 DIAGNOSIS — E1169 Type 2 diabetes mellitus with other specified complication: Secondary | ICD-10-CM

## 2020-07-02 DIAGNOSIS — Z1211 Encounter for screening for malignant neoplasm of colon: Secondary | ICD-10-CM

## 2020-07-02 DIAGNOSIS — I10 Essential (primary) hypertension: Secondary | ICD-10-CM

## 2020-07-02 DIAGNOSIS — Z7984 Long term (current) use of oral hypoglycemic drugs: Secondary | ICD-10-CM

## 2020-07-02 DIAGNOSIS — R7989 Other specified abnormal findings of blood chemistry: Secondary | ICD-10-CM

## 2020-07-02 DIAGNOSIS — N182 Chronic kidney disease, stage 2 (mild): Secondary | ICD-10-CM

## 2020-07-02 DIAGNOSIS — E7841 Elevated Lipoprotein(a): Secondary | ICD-10-CM

## 2020-07-02 DIAGNOSIS — E1165 Type 2 diabetes mellitus with hyperglycemia: Secondary | ICD-10-CM

## 2020-07-02 DIAGNOSIS — I129 Hypertensive chronic kidney disease with stage 1 through stage 4 chronic kidney disease, or unspecified chronic kidney disease: Secondary | ICD-10-CM

## 2020-07-02 DIAGNOSIS — B351 Tinea unguium: Secondary | ICD-10-CM

## 2020-07-02 DIAGNOSIS — E785 Hyperlipidemia, unspecified: Secondary | ICD-10-CM

## 2020-07-02 LAB — GLUCOSE, CAPILLARY: Glucose-Capillary: 118 mg/dL — ABNORMAL HIGH (ref 70–99)

## 2020-07-02 LAB — POCT GLYCOSYLATED HEMOGLOBIN (HGB A1C): Hemoglobin A1C: 5.9 % — AB (ref 4.0–5.6)

## 2020-07-02 MED ORDER — LOSARTAN POTASSIUM 25 MG PO TABS: 25.0000 mg | ORAL_TABLET | Freq: Every day | ORAL | 3 refills | Status: DC

## 2020-07-02 MED ORDER — METFORMIN HCL ER 500 MG PO TB24: 500.0000 mg | ORAL_TABLET | Freq: Every day | ORAL | 3 refills | Status: DC

## 2020-07-02 NOTE — Assessment & Plan Note (Addendum)
Although patient is prescribed metformin XR 500mg  twice daily, he has only taken three pills in the past two weeks. Hemoglobin A1c is 5.9 today. He has been working hard on diet and exercise. -Advised patient to take metformin XR 500mg  only once daily -Lipid Panel today

## 2020-07-02 NOTE — Progress Notes (Signed)
   CC: Follow-up T2DM, HTN  HPI:  Joe Pearson is a 50 y.o. male with past medical history significant for HTN, T2DM, obesity who presents to clinic for follow-up. Refer to problem list for charting of this encounter.   Past Medical History:  Diagnosis Date  . NAFLD (nonalcoholic fatty liver disease)    Review of Systems:  Denies chest pain, shortness of breath, abdominal pain, nausea, vomiting, diarrhea, lightheadedness, dizziness.  Physical Exam:  Vitals:   07/02/20 1356  BP: 140/75  Pulse: 73  Temp: 98 F (36.7 C)  TempSrc: Oral  SpO2: 98%  Weight: 231 lb (104.8 kg)   Physical Exam Vitals reviewed.  Constitutional:      General: He is not in acute distress. Cardiovascular:     Rate and Rhythm: Normal rate and regular rhythm.     Pulses: Normal pulses.     Heart sounds: Normal heart sounds.  Pulmonary:     Effort: Pulmonary effort is normal.     Breath sounds: Normal breath sounds.  Abdominal:     General: Abdomen is flat. Bowel sounds are normal.     Palpations: Abdomen is soft.     Tenderness: There is no abdominal tenderness.  Psychiatric:        Mood and Affect: Mood normal.        Behavior: Behavior normal.      Assessment & Plan:   See Encounters Tab for problem based charting.  Patient discussed with Dr. Heide Spark

## 2020-07-02 NOTE — Assessment & Plan Note (Signed)
Patient has discoloration, thickening and separation of toenail from nail bed of his left great toe. Discussed with patient that topical therapy is unlikely to significantly improve his condition and that he would benefit from oral terbinafine daily for 12 weeks. Patient significantly concerned with this medication due to the need for monitoring of liver function tests. Patient may benefit from a trial of topical agent if continues to persist.

## 2020-07-02 NOTE — Assessment & Plan Note (Signed)
No significant proteinuria on recent microalbumin/creatinine urine study. No recent basic metabolic panel. -Obtain BMP today

## 2020-07-02 NOTE — Patient Instructions (Signed)
Mr. Urieta,  It was a pleasure seeing you in clinic today.  For your hypertension (high blood pressure): Please start taking losartan 25mg  daily. This is a very low dose of a very safe and effective medication. It has also been proven to protect the kidneys in the setting of high blood pressure and diabetes. We will also check a basic metabolic panel today which looks at your kidney function, electrolytes and blood sugar.  For your diabetes (high blood sugars): Please continue taking the metformin 500mg  extended release tablet once every day. You are doing a great job with diet and exercise.  Finally, we would like to check your cholesterol levels as this can be important to monitor and treat.  Sincerely, Dr. , MD

## 2020-07-02 NOTE — Assessment & Plan Note (Addendum)
Patient's blood pressure elevated to 140/75 today. Patient previously prescribed lisinopril 10mg  daily, however he self discontinued this medication due to concern of adverse effects. Discussed with patient that he requires tighter control of his hypertension. He reports significant concern with starting daily medications, however he is amenable to trying losartan. -Prescribe losartan 25mg  daily, assess adherence at next visit -BMP today -Follow-up in 4weeks

## 2020-07-03 ENCOUNTER — Telehealth: Payer: Self-pay

## 2020-07-03 DIAGNOSIS — R7989 Other specified abnormal findings of blood chemistry: Secondary | ICD-10-CM | POA: Insufficient documentation

## 2020-07-03 DIAGNOSIS — E785 Hyperlipidemia, unspecified: Secondary | ICD-10-CM | POA: Insufficient documentation

## 2020-07-03 LAB — BMP8+ANION GAP
Anion Gap: 14 mmol/L (ref 10.0–18.0)
BUN/Creatinine Ratio: 16 (ref 9–20)
BUN: 18 mg/dL (ref 6–24)
CO2: 24 mmol/L (ref 20–29)
Calcium: 9.6 mg/dL (ref 8.7–10.2)
Chloride: 103 mmol/L (ref 96–106)
Creatinine, Ser: 1.15 mg/dL (ref 0.76–1.27)
GFR calc Af Amer: 85 mL/min/{1.73_m2} (ref >59)
GFR calc non Af Amer: 73 mL/min/{1.73_m2} (ref >59)
Glucose: 101 mg/dL — ABNORMAL HIGH (ref 65–99)
Potassium: 4.9 mmol/L (ref 3.5–5.2)
Sodium: 141 mmol/L (ref 134–144)

## 2020-07-03 LAB — LIPID PANEL
Chol/HDL Ratio: 3.3 ratio (ref 0.0–5.0)
Cholesterol, Total: 164 mg/dL (ref 100–199)
HDL: 49 mg/dL (ref >39)
LDL Chol Calc (NIH): 103 mg/dL — ABNORMAL HIGH (ref 0–99)
Triglycerides: 58 mg/dL (ref 0–149)
VLDL Cholesterol Cal: 12 mg/dL (ref 5–40)

## 2020-07-03 MED ORDER — ROSUVASTATIN CALCIUM 10 MG PO TABS: 10.0000 mg | ORAL_TABLET | Freq: Every day | ORAL | 11 refills | Status: DC

## 2020-07-03 NOTE — Telephone Encounter (Signed)
Yes. Patient called to discuss lab results. Documentation of plan updated in encounter from yesterday.

## 2020-07-03 NOTE — Assessment & Plan Note (Signed)
ADDENDUM: Lipid Profile revealing LDL of 103. ASCVD risk of ~6%. Contacted patient by phone to discuss results with recommendation to start statin. He agrees but is concerned about adverse effects. Discussed the possibility of myalgias. He agrees to give rosuvastatin a trial. -Start rosuvastatin 10mg  daily -Lipid profile in 3-6 months

## 2020-07-03 NOTE — Addendum Note (Signed)
Addended by: Bufford Spikes on: 07/03/2020 12:44 PM   Modules accepted: Orders

## 2020-07-03 NOTE — Telephone Encounter (Signed)
Pls contact pt [phone]; pt missed a call

## 2020-07-03 NOTE — Assessment & Plan Note (Signed)
ADDENDUM: Upon calling patient to discuss lab results, patient brought up that he had labs collected at [facility] in December and was noted to have an elevated TSH of 4.7. Recommended that patient have follow-up free T4 at his next visit, however he would like evaluation sooner than next month. -Free T4 lab only visit

## 2020-07-03 NOTE — Addendum Note (Signed)
Addended by: Jasmine December on: 07/03/2020 12:15 PM   Modules accepted: Orders

## 2020-07-03 NOTE — Addendum Note (Signed)
Addended by: Bufford Spikes on: 07/03/2020 12:59 PM   Modules accepted: Orders

## 2020-07-06 NOTE — Addendum Note (Signed)
Addended by: Jasmine December on: 07/06/2020 01:28 PM   Modules accepted: Level of Service

## 2020-07-06 NOTE — Progress Notes (Signed)
Internal Medicine Clinic Attending  Case discussed with Dr. Johnson  At the time of the visit.  We reviewed the resident's history and exam and pertinent patient test results.  I agree with the assessment, diagnosis, and plan of care documented in the resident's note.  

## 2020-07-07 LAB — T4, FREE: Free T4: 1.22 ng/dL (ref 0.82–1.77)

## 2020-07-07 LAB — SPECIMEN STATUS REPORT

## 2020-07-08 ENCOUNTER — Telehealth: Payer: Self-pay

## 2020-07-08 NOTE — Telephone Encounter (Signed)
Dr. Laural Benes,  Did you try calling this patient today?  He called and stated he was outside and missed a call, he thought it was from you? Thanks, SChaplin, RN,BSN

## 2020-07-30 ENCOUNTER — Encounter: Payer: Self-pay | Admitting: Internal Medicine

## 2020-07-30 ENCOUNTER — Ambulatory Visit: Payer: Self-pay | Admitting: Internal Medicine

## 2020-07-30 ENCOUNTER — Other Ambulatory Visit: Payer: Self-pay

## 2020-07-30 VITALS — BP 130/76 | HR 72 | Temp 98.5°F | Ht 66.0 in | Wt 233.4 lb

## 2020-07-30 DIAGNOSIS — J302 Other seasonal allergic rhinitis: Secondary | ICD-10-CM

## 2020-07-30 DIAGNOSIS — E7841 Elevated Lipoprotein(a): Secondary | ICD-10-CM

## 2020-07-30 DIAGNOSIS — B351 Tinea unguium: Secondary | ICD-10-CM

## 2020-07-30 DIAGNOSIS — Z1211 Encounter for screening for malignant neoplasm of colon: Secondary | ICD-10-CM

## 2020-07-30 DIAGNOSIS — I10 Essential (primary) hypertension: Secondary | ICD-10-CM

## 2020-07-30 NOTE — Patient Instructions (Addendum)
To Mr. Slane,  It was a pleasure meeting you today! Today we talked about the fungal infection of your foot, blood pressure, and cholesterol medication, and allergies. For your fungal infection, I will place a referral to the podiatrist to get a second opinion on treatment options for your toe. Your blood pressure looks well today, I would continue taking your losartan. And for your cholesterol I would continue the Crestor. For your allergies, please pick up Flonase, which is a nasal spray. Please spray 2 times in each nostril each morning. Additionally, I would recommend picking up zyrtec at the store and take one tablet each day.  Have a good day! Dolan Amen, MD

## 2020-07-31 ENCOUNTER — Encounter: Payer: Self-pay | Admitting: Internal Medicine

## 2020-07-31 DIAGNOSIS — J302 Other seasonal allergic rhinitis: Secondary | ICD-10-CM | POA: Insufficient documentation

## 2020-07-31 LAB — FECAL OCCULT BLOOD, IMMUNOCHEMICAL: Fecal Occult Bld: NEGATIVE

## 2020-07-31 NOTE — Assessment & Plan Note (Signed)
Patient complains of seasonal allergies with cough and nasal congestion that started within the past two weeks.  - OTC Zyrtec and Flonase 2 sprays per nares daily.

## 2020-07-31 NOTE — Assessment & Plan Note (Signed)
Patient with onychomycosis presents for follow up. He has tried an over an over the counter cream that has made little assistance in relieving his symptoms. He is still hesitant to trial oral terbinafine due to the protracted course and side effect profile of the medication. He would like to attempt a different course of action that does not utilize oral medications. We discussed podiatry, which he is interested in trying.  - referral to podiatry

## 2020-07-31 NOTE — Assessment & Plan Note (Signed)
Patient with BP of  Vitals with BMI 07/30/2020 07/02/2020 03/18/2020  Height 5\' 6"  - 5\' 6"   Weight 233 lbs 6 oz 231 lbs 231 lbs 2 oz  BMI 37.69 - 37.32  Systolic 130 140 -  Diastolic 76 75 -  Pulse 72 73 -   He states that he has not been taking his medications because he has been with his daughter in [place] for the past month and did not bring any medications. He states he will start taking his medication more consistently.  - Continue Cozaar 25 mg daily

## 2020-07-31 NOTE — Progress Notes (Signed)
   CC: Follow up for onychomycosis   HPI:  [name] is a 50 y.o. M/F, with a PMH noted below, who presents to the clinic for f/u for his onychomycosis. To see the management of their acute and chronic conditions, please see the A&P note under the Encounters tab.   Past Medical History:  Diagnosis Date  . NAFLD (nonalcoholic fatty liver disease)    Review of Systems:   Review of Systems  Constitutional: Negative for chills, fever and weight loss.  Cardiovascular: Negative for chest pain, palpitations and orthopnea.  Gastrointestinal: Positive for abdominal pain. Negative for blood in stool, constipation, diarrhea, nausea and vomiting.       Occasional chronic L sided abdominal pain.   Genitourinary: Negative for dysuria and urgency.  Musculoskeletal: Negative for back pain, myalgias and neck pain.     Physical Exam:  Vitals:   07/30/20 1418  BP: 130/76  Pulse: 72  Temp: 98.5 F (36.9 C)  TempSrc: Oral  SpO2: 97%  Weight: 233 lb 6.4 oz (105.9 kg)  Height: 5\' 6"  (1.676 m)   Physical Exam Vitals reviewed.  Constitutional:      General: He is not in acute distress.    Appearance: He is obese. He is not ill-appearing, toxic-appearing or diaphoretic.  Cardiovascular:     Rate and Rhythm: Normal rate and regular rhythm.     Heart sounds: Normal heart sounds. No murmur heard. No gallop.   Pulmonary:     Effort: Pulmonary effort is normal. No respiratory distress.     Breath sounds: Normal breath sounds. No wheezing, rhonchi or rales.  Abdominal:     General: Abdomen is flat. Bowel sounds are normal.     Palpations: Abdomen is soft. There is no mass.     Tenderness: There is no abdominal tenderness. There is no guarding or rebound.     Hernia: No hernia is present.  Skin:    Comments: L great toe nail is disfigured, yellow, and coming off the nail bed.   Neurological:     Mental Status: He is alert and oriented to person, place, and time.      Assessment &  Plan:   See Encounters Tab for problem based charting.  Patient discussed with Dr. 

## 2020-07-31 NOTE — Assessment & Plan Note (Addendum)
Patient's last visit showed ASCVD risk of ~6% and was started on rosuvastatin 10 mg daily. He has not been taking his medication intermittently, but is planning on becoming more consistent about taking his medications.  - Continue current regimen - Lipid profile 5 months

## 2020-08-06 ENCOUNTER — Ambulatory Visit: Payer: Self-pay

## 2020-08-21 NOTE — Progress Notes (Signed)
Internal Medicine Clinic Attending  Case discussed with Dr. Winters at the time of the visit.  We reviewed the resident's history and exam and pertinent patient test results.  I agree with the assessment, diagnosis, and plan of care documented in the resident's note.  Remell Giaimo, M.D., Ph.D.  

## 2020-09-01 ENCOUNTER — Ambulatory Visit: Payer: Self-pay

## 2020-09-10 NOTE — Addendum Note (Signed)
Addended by: Neomia Dear on: 09/10/2020 03:18 PM   Modules accepted: Orders

## 2020-11-17 ENCOUNTER — Encounter: Payer: Self-pay | Admitting: *Deleted

## 2021-03-04 ENCOUNTER — Ambulatory Visit (INDEPENDENT_AMBULATORY_CARE_PROVIDER_SITE_OTHER): Payer: Self-pay | Admitting: Internal Medicine

## 2021-03-04 ENCOUNTER — Encounter: Payer: Self-pay | Admitting: Internal Medicine

## 2021-03-04 DIAGNOSIS — K219 Gastro-esophageal reflux disease without esophagitis: Secondary | ICD-10-CM

## 2021-03-04 DIAGNOSIS — J069 Acute upper respiratory infection, unspecified: Secondary | ICD-10-CM | POA: Insufficient documentation

## 2021-03-04 MED ORDER — HYDROCOD POLST-CPM POLST ER 10-8 MG/5ML PO SUER: 5.0000 mL | Freq: Two times a day (BID) | ORAL | 0 refills | Status: AC

## 2021-03-04 MED ORDER — AMOXICILLIN-POT CLAVULANATE 875-125 MG PO TABS: 1.0000 | ORAL_TABLET | Freq: Two times a day (BID) | ORAL | 0 refills | Status: AC

## 2021-03-04 MED ORDER — PANTOPRAZOLE SODIUM 40 MG PO TBEC: 40.0000 mg | DELAYED_RELEASE_TABLET | Freq: Every day | ORAL | 3 refills | Status: DC

## 2021-03-04 NOTE — Patient Instructions (Signed)
Thank you, Mr.Drelyn DYLON CORREA for allowing [place] to provide your care today. Today we discussed:  Upper respiratory infection: Since your cough and congestion have persisted we are concerned you may be having a bacterial sinusitis secondary to your viral upper respiratory infection. We have ordered an antibiotic Augmentin for you which you will take twice a day for 7 days. We have also ordered the Tussionex 5 day supply for you which can help with your cough.  I have re-ordered Protonix for you which will help with acid reflux symptoms. You need to take this medication everyday.   My Chart Access: [email or web address]  Please follow-up as needed.  Please make sure to arrive 15 minutes prior to your next appointment. If you arrive late, you may be asked to reschedule.    We look forward to seeing you next time. Please call our clinic at [phone] if you have any questions or concerns. The best time to call is Monday-Friday from 9am-4pm, but there is someone available 24/7. If after hours or the weekend, call the main hospital number and ask for the Internal Medicine Resident On-Call. If you need medication refills, please notify your pharmacy one week in advance and they will send [place] a request.   Thank you for letting us take part in your care. Wishing you the best!  [name], MD 03/04/2021, 4:04 PM IM Resident, PGY-1

## 2021-03-04 NOTE — Progress Notes (Signed)
   CC: cough, congestion  This is a telephone encounter between Joe Pearson and Joe Pearson on 03/04/2021 for new cough and congestion the last 2 weeks. The visit was conducted with the patient located at home and Joe Pearson at [facility]. The patient's identity was confirmed using their DOB and current address. The patient has consented to being evaluated through a telephone encounter and understands the associated risks (an examination cannot be done and the patient may need to come in for an appointment) / benefits (allows the patient to remain at home, decreasing exposure to coronavirus). I personally spent 15 minutes on medical discussion.   HPI:  [name] is a 50 y.o. with PMH as below.   Please see A&P for assessment of the patient's acute and chronic medical conditions.   Past Medical History:  Diagnosis Date   NAFLD (nonalcoholic fatty liver disease)    Review of Systems:   + subjective fever, chills, congestion, post nasal drip, cough, HA while coughing, light headed with coughing only, acid reflux symptoms - SOB, CP    Assessment & Plan:   See Encounters Tab for problem based charting.  Patient seen with Dr. Dewain Penning, MD 03/04/21, 3:19 PM Pager: [phone] Internal Medicine Resident, PGY-1 [facility]

## 2021-03-04 NOTE — Assessment & Plan Note (Signed)
Patient presents for Telehealth visit for 2 week history of cough and congestion. Patient started having sinus pain 2 weeks ago and then developed dry cough with occasional sputum production. He endorses some lightheadedness and headache when he coughs as well as intermittently having subjective fever and night sweats. He denies SOB and CP. His symptoms have persisted without improvement on Nyquil, Robitussin, Mucinex. He has also had worsening of his acid reflux recently which may be contributing to cough. Hasn't taken Protonix recently. His cough is preventing him from getting restful sleep and this has been very disruptive for him. He has tested himself for COVID19 three times in the last 2 weeks, all tests negative.  Given persistence of symptoms with sinus congestion and subjective fevers, we will treat for possible bacterial sinusitis secondary to viral URI.   Plan: -Augmentin 875mg  q12hr for 7 days -Tussionex cough solution for 5d -Protonix refilled for acid reflux symptoms - Instructed to call back if patient does not have improvement in symtpoms

## 2021-03-09 NOTE — Progress Notes (Signed)
Internal Medicine Clinic Attending  I spoke with the patient.  I personally confirmed the key portions of the history and exam documented by Dr. Sharene Butters   and I reviewed pertinent patient test results.  The assessment, diagnosis, and plan were formulated together and I agree with the documentation in the resident's note.

## 2021-03-12 ENCOUNTER — Other Ambulatory Visit: Payer: Self-pay | Admitting: *Deleted

## 2021-03-12 DIAGNOSIS — E7841 Elevated Lipoprotein(a): Secondary | ICD-10-CM

## 2021-03-12 DIAGNOSIS — E1165 Type 2 diabetes mellitus with hyperglycemia: Secondary | ICD-10-CM

## 2021-03-12 DIAGNOSIS — E1169 Type 2 diabetes mellitus with other specified complication: Secondary | ICD-10-CM

## 2021-03-12 DIAGNOSIS — I10 Essential (primary) hypertension: Secondary | ICD-10-CM

## 2021-03-12 DIAGNOSIS — N182 Chronic kidney disease, stage 2 (mild): Secondary | ICD-10-CM

## 2021-03-12 NOTE — Telephone Encounter (Signed)
Patient requesting refill on Metformin, blood pressure and cholesterol meds.

## 2021-03-15 MED ORDER — ROSUVASTATIN CALCIUM 10 MG PO TABS: 10.0000 mg | ORAL_TABLET | Freq: Every day | ORAL | 11 refills | Status: DC

## 2021-03-15 MED ORDER — METFORMIN HCL ER 500 MG PO TB24: 500.0000 mg | ORAL_TABLET | Freq: Every day | ORAL | 3 refills | Status: DC

## 2021-03-15 MED ORDER — LOSARTAN POTASSIUM 25 MG PO TABS
25.0000 mg | ORAL_TABLET | Freq: Every day | ORAL | 3 refills | Status: DC
Start: 2021-03-15 — End: 2021-04-15

## 2021-03-23 ENCOUNTER — Ambulatory Visit (INDEPENDENT_AMBULATORY_CARE_PROVIDER_SITE_OTHER): Payer: 59 | Admitting: Internal Medicine

## 2021-03-23 ENCOUNTER — Encounter: Payer: Self-pay | Admitting: Internal Medicine

## 2021-03-23 VITALS — BP 126/83 | HR 80 | Temp 98.4°F | Wt 248.9 lb

## 2021-03-23 DIAGNOSIS — Z7984 Long term (current) use of oral hypoglycemic drugs: Secondary | ICD-10-CM

## 2021-03-23 DIAGNOSIS — J310 Chronic rhinitis: Secondary | ICD-10-CM

## 2021-03-23 DIAGNOSIS — E785 Hyperlipidemia, unspecified: Secondary | ICD-10-CM | POA: Diagnosis not present

## 2021-03-23 DIAGNOSIS — Z23 Encounter for immunization: Secondary | ICD-10-CM

## 2021-03-23 DIAGNOSIS — E7841 Elevated Lipoprotein(a): Secondary | ICD-10-CM

## 2021-03-23 DIAGNOSIS — E1165 Type 2 diabetes mellitus with hyperglycemia: Secondary | ICD-10-CM

## 2021-03-23 DIAGNOSIS — J329 Chronic sinusitis, unspecified: Secondary | ICD-10-CM

## 2021-03-23 DIAGNOSIS — R058 Other specified cough: Secondary | ICD-10-CM

## 2021-03-23 LAB — POCT GLYCOSYLATED HEMOGLOBIN (HGB A1C): Hemoglobin A1C: 6.6 % — AB (ref 4.0–5.6)

## 2021-03-23 LAB — GLUCOSE, CAPILLARY: Glucose-Capillary: 127 mg/dL — ABNORMAL HIGH (ref 70–99)

## 2021-03-23 MED ORDER — FLUTICASONE PROPIONATE 50 MCG/ACT NA SUSP: 1.0000 | Freq: Every day | NASAL | 2 refills | Status: DC

## 2021-03-23 NOTE — Assessment & Plan Note (Addendum)
Joe Pearson present with sinus congestion this started at the end of September.  He was seen via telehealth on Oct 10th and given Augmentin 875 mg BID for 7days.  He states that he completed the antibiotic and did not notice improvement in symptoms.    A/P: Patient presents with 6 weeks of nasal congestion and post-nasal drainage. I encouraged patient to try saline irrigation with the Neti-Pot that he has at home once daily.  Following saline irrigation, asked patient to use flonase spray to help with inflammation.

## 2021-03-23 NOTE — Assessment & Plan Note (Signed)
He reports that he has been taking crestor 10 mg every day.  He does not want blood work at todays visit.  ASCVD% at 7.2%  -repeat lipid profile at next visit -continue crestor 10 mg

## 2021-03-23 NOTE — Patient Instructions (Addendum)
Joe Pearson, it was a pleasure seeing you today!  Today we discussed: Sinus congestion/ cough-  Please try using a Netipot to complete sinus rinses each morning.  After that use flonase by spraying into each nostril.  This should help with the swelling and inflammation that is in your sinuses.   Diabetes- Your A1c today has increased from 5.9 to 6.6 today.  Please continue taking Metformin for this. I also referred you to an eye doctor to check on the small blood vessels in your eye which can be affected by diabetes.  Swelling in legs- You can purchase compression socks over the counter to help with swelling.  I have ordered the following labs today:  Lab Orders         Glucose, capillary         POC Hbg A1C      Tests ordered today:  A1c  Referrals ordered today:   Referral Orders  No referral(s) requested today     I have ordered the following medication/changed the following medications:   Stop the following medications: There are no discontinued medications.   Start the following medications: No orders of the defined types were placed in this encounter.    Follow-up: 3 months   Please make sure to arrive 15 minutes prior to your next appointment. If you arrive late, you may be asked to reschedule.   We look forward to seeing you next time. Please call our clinic at [phone] if you have any questions or concerns. The best time to call is Monday-Friday from 9am-4pm, but there is someone available 24/7. If after hours or the weekend, call the main hospital number and ask for the Internal Medicine Resident On-Call. If you need medication refills, please notify your pharmacy one week in advance and they will send [place] a request.  Thank you for letting us take part in your care. Wishing you the best!  Thank you, Dr. [facility]

## 2021-03-23 NOTE — Assessment & Plan Note (Addendum)
Patient reports that he has been taking metformin 500 mg once daily.  He does not have side effects with medication.  Over the last couple months he has started eating more fast food and not exercising.  Repeat A1c increased from 5.9 to 6.6 today.   -Continue metformin XR 500 mg -A1c -referral to opthalmology

## 2021-03-23 NOTE — Progress Notes (Signed)
Subjective:  CC: coughing and sinus congestion  HPI:  Joe Pearson is a 50 y.o. male with a past medical history stated below and presents today for coughing and sinus congestion. Please see problem based assessment and plan for additional details.  Past Medical History:  Diagnosis Date   COVID-19 virus vaccine not available 02/05/2020   NAFLD (nonalcoholic fatty liver disease)     Current Outpatient Medications on File Prior to Visit  Medication Sig Dispense Refill   losartan (COZAAR) 25 MG tablet Take 1 tablet (25 mg total) by mouth daily. 90 tablet 3   metFORMIN (GLUCOPHAGE XR) 500 MG 24 hr tablet Take 1 tablet (500 mg total) by mouth daily with breakfast. 90 tablet 3   pantoprazole (PROTONIX) 40 MG tablet Take 1 tablet (40 mg total) by mouth daily. 30 tablet 3   rosuvastatin (CRESTOR) 10 MG tablet Take 1 tablet (10 mg total) by mouth daily. 30 tablet 11   No current facility-administered medications on file prior to visit.    Family History  Problem Relation Age of Onset   Cancer Mother        breast   Sudden death Neg Hx    Hyperlipidemia Neg Hx    Hypertension Neg Hx    Heart attack Neg Hx    Diabetes Neg Hx     Social History   Socioeconomic History   Marital status: Single    Spouse name: Not on file   Number of children: Not on file   Years of education: Not on file   Highest education level: Not on file  Occupational History   Not on file  Tobacco Use   Smoking status: Never   Smokeless tobacco: Never  Substance and Sexual Activity   Alcohol use: No    Alcohol/week: 0.0 standard drinks   Drug use: No   Sexual activity: Not on file  Other Topics Concern   Not on file  Social History Narrative   Not on file   Social Determinants of Health   Financial Resource Strain: Not on file  Food Insecurity: Not on file  Transportation Needs: Not on file  Physical Activity: Not on file  Stress: Not on file  Social Connections: Not on file   Intimate Partner Violence: Not on file    Review of Systems: ROS negative except for what is noted on the assessment and plan.  Objective:   Vitals:   03/23/21 1040  BP: 126/83  Pulse: 80  Temp: 98.4 F (36.9 C)  TempSrc: Oral  SpO2: 98%  Weight: 248 lb 14.4 oz (112.9 kg)    Physical Exam: Gen: A&O x3 and in no apparent distress, well appearing and nourished. HEENT:    Head - tenderness to palpation above frontal and maxillary sinuses   Eye - visual acuity grossly intact, conjunctiva clear, sclera non-icteric, EOM intact.    Mouth - No obvious caries or periodontal disease. Neck: no masses or nodules, AROM intact. CV: RRR, no murmurs, S1/S2 presents  Resp: Clear to ascultation bilaterally  Abd: BS (+) x4, soft, non-tender abdomen, without hepatosplenomegaly or masses MSK: Grossly normal AROM and strength x4 extremities. Skin: good skin turgor, no rashes, unusual bruising, or prominent lesions.  Neuro: No focal deficits, grossly normal sensation and coordination.  Psych: Oriented x3 and responding appropriately. Intact memory, normal mood, judgement, affect, and insight.    Assessment & Plan:  See Encounters Tab for problem based charting.  Patient seen with Dr. Oswaldo Done  Rudene Christians, D.O. [facility]  PGY-1 Pager: [phone]  Phone: [phone] Date 03/24/2021  Time 7:40 AM

## 2021-03-23 NOTE — Assessment & Plan Note (Signed)
Patient declined flu vaccination today because it is against his beliefs.

## 2021-03-24 NOTE — Progress Notes (Signed)
Internal Medicine Clinic Attending  I saw and evaluated the patient.  I personally confirmed the key portions of the history and exam documented by Dr. Masters and I reviewed pertinent patient test results.  The assessment, diagnosis, and plan were formulated together and I agree with the documentation in the resident's note.  

## 2021-03-24 NOTE — Assessment & Plan Note (Signed)
Patient complains of non-productive cough that has been present since onset of sinus symptoms in early October.  He states that he often wakes up due to coughing.   A/P: Coughing is likely due to post-nasal drip from sinuses.   -neti-Pot -Flonase

## 2021-04-06 ENCOUNTER — Encounter: Payer: Self-pay | Admitting: Internal Medicine

## 2021-04-15 ENCOUNTER — Ambulatory Visit (INDEPENDENT_AMBULATORY_CARE_PROVIDER_SITE_OTHER): Payer: 59 | Admitting: Internal Medicine

## 2021-04-15 ENCOUNTER — Encounter: Payer: Self-pay | Admitting: Internal Medicine

## 2021-04-15 VITALS — BP 135/87 | HR 87 | Temp 98.7°F | Ht 60.0 in | Wt 245.2 lb

## 2021-04-15 DIAGNOSIS — E1169 Type 2 diabetes mellitus with other specified complication: Secondary | ICD-10-CM

## 2021-04-15 DIAGNOSIS — I129 Hypertensive chronic kidney disease with stage 1 through stage 4 chronic kidney disease, or unspecified chronic kidney disease: Secondary | ICD-10-CM

## 2021-04-15 DIAGNOSIS — E1165 Type 2 diabetes mellitus with hyperglycemia: Secondary | ICD-10-CM | POA: Diagnosis not present

## 2021-04-15 DIAGNOSIS — E7841 Elevated Lipoprotein(a): Secondary | ICD-10-CM

## 2021-04-15 DIAGNOSIS — E1122 Type 2 diabetes mellitus with diabetic chronic kidney disease: Secondary | ICD-10-CM | POA: Diagnosis not present

## 2021-04-15 DIAGNOSIS — B0229 Other postherpetic nervous system involvement: Secondary | ICD-10-CM

## 2021-04-15 DIAGNOSIS — B351 Tinea unguium: Secondary | ICD-10-CM

## 2021-04-15 DIAGNOSIS — E785 Hyperlipidemia, unspecified: Secondary | ICD-10-CM

## 2021-04-15 DIAGNOSIS — I10 Essential (primary) hypertension: Secondary | ICD-10-CM

## 2021-04-15 DIAGNOSIS — N182 Chronic kidney disease, stage 2 (mild): Secondary | ICD-10-CM

## 2021-04-15 DIAGNOSIS — L608 Other nail disorders: Secondary | ICD-10-CM

## 2021-04-15 DIAGNOSIS — K760 Fatty (change of) liver, not elsewhere classified: Secondary | ICD-10-CM

## 2021-04-15 DIAGNOSIS — K219 Gastro-esophageal reflux disease without esophagitis: Secondary | ICD-10-CM

## 2021-04-15 LAB — GLUCOSE, CAPILLARY: Glucose-Capillary: 123 mg/dL — ABNORMAL HIGH (ref 70–99)

## 2021-04-15 LAB — POCT GLYCOSYLATED HEMOGLOBIN (HGB A1C): Hemoglobin A1C: 6.5 % — AB (ref 4.0–5.6)

## 2021-04-15 MED ORDER — METFORMIN HCL ER 500 MG PO TB24: 500.0000 mg | ORAL_TABLET | Freq: Every day | ORAL | 3 refills | Status: DC

## 2021-04-15 MED ORDER — PANTOPRAZOLE SODIUM 40 MG PO TBEC: 40.0000 mg | DELAYED_RELEASE_TABLET | Freq: Every day | ORAL | 3 refills | Status: DC

## 2021-04-15 MED ORDER — ROSUVASTATIN CALCIUM 10 MG PO TABS: 10.0000 mg | ORAL_TABLET | Freq: Every day | ORAL | 11 refills | Status: DC

## 2021-04-15 MED ORDER — GABAPENTIN 300 MG PO CAPS: 300.0000 mg | ORAL_CAPSULE | Freq: Every day | ORAL | 0 refills | Status: DC

## 2021-04-15 NOTE — Patient Instructions (Signed)
Thank you, [name] for allowing [place] to provide your care today. Today we discussed diabetes, leg pain, toe nail fungus.    Labs/Tests Ordered: Lab Orders         CMP14 + Anion Gap         POC Hbg A1C      Referrals Ordered:  Referral Orders         Ambulatory referral to Podiatry      Medication Changes:  - losartan   Meds ordered this encounter  Medications   metFORMIN (GLUCOPHAGE XR) 500 MG 24 hr tablet    Sig: Take 1 tablet (500 mg total) by mouth daily with breakfast.    Dispense:  90 tablet    Refill:  3   rosuvastatin (CRESTOR) 10 MG tablet    Sig: Take 1 tablet (10 mg total) by mouth daily.    Dispense:  30 tablet    Refill:  11   pantoprazole (PROTONIX) 40 MG tablet    Sig: Take 1 tablet (40 mg total) by mouth daily.    Dispense:  30 tablet    Refill:  3   gabapentin (NEURONTIN) 300 MG capsule    Sig: Take 1 capsule (300 mg total) by mouth daily for 60 doses.    Dispense:  60 capsule    Refill:  0     Health Maintenance Screening: Diabetes Health Maintenance Due  Topic Date Due   OPHTHALMOLOGY EXAM  Never done   FOOT EXAM  07/02/2021   HEMOGLOBIN A1C  09/20/2021     Instructions:   Follow up: 3 months   Remember: If you have any questions or concerns, call our clinic at [phone] or after hours call [phone] and ask for the internal medicine resident on call.  Dellia Cloud, D.O. [facility]

## 2021-04-15 NOTE — Progress Notes (Signed)
    Subjective:  CC: DM  HPI:  Joe Pearson is a 50 y.o. male with a past medical history stated below and presents today for DM. Please see problem based assessment and plan for additional details.  Past Medical History:  Diagnosis Date   COVID-19 virus vaccine not available 02/05/2020   NAFLD (nonalcoholic fatty liver disease)     Current Outpatient Medications on File Prior to Visit  Medication Sig Dispense Refill   fluticasone (FLONASE) 50 MCG/ACT nasal spray Place 1 spray into both nostrils daily. 9.9 mL 2   No current facility-administered medications on file prior to visit.    Family History  Problem Relation Age of Onset   Cancer Mother        breast   Sudden death Neg Hx    Hyperlipidemia Neg Hx    Hypertension Neg Hx    Heart attack Neg Hx    Diabetes Neg Hx     Social History   Socioeconomic History   Marital status: Single    Spouse name: Not on file   Number of children: Not on file   Years of education: Not on file   Highest education level: Not on file  Occupational History   Not on file  Tobacco Use   Smoking status: Never   Smokeless tobacco: Never  Substance and Sexual Activity   Alcohol use: No    Alcohol/week: 0.0 standard drinks   Drug use: No   Sexual activity: Not on file  Other Topics Concern   Not on file  Social History Narrative   Not on file   Social Determinants of Health   Financial Resource Strain: Not on file  Food Insecurity: Not on file  Transportation Needs: Not on file  Physical Activity: Not on file  Stress: Not on file  Social Connections: Not on file  Intimate Partner Violence: Not on file    Review of Systems: ROS negative except for what is noted on the assessment and plan.  Objective:   Vitals:   04/15/21 1547  BP: 135/87  Pulse: 87  Temp: 98.7 F (37.1 C)  TempSrc: Oral  SpO2: 97%  Weight: 245 lb 3.2 oz (111.2 kg)  Height: 5' (1.524 m)    Physical Exam: Gen: A&O x3 and in no apparent  distress, well appearing and nourished. CV: RRR, no murmurs, S1/S2 presents  Resp: Clear to ascultation bilaterally  Abd: BS (+) x4, soft, non-tender abdomen, without hepatosplenomegaly or masses MSK: Grossly normal AROM and strength x4 extremities. Skin: good skin turgor, no rashes, unusual bruising, or prominent lesions. Does have hyperkeratosis of toe nails and nail irregularity, change in color and thickening Neuro: No focal deficits, grossly normal sensation and coordination.  Psych: Oriented x3 and responding appropriately. Intact memory, normal mood, judgement, affect, and insight.    Assessment & Plan:  See Encounters Tab for problem based charting.  Patient discussed with Dr.  Dyanne Iha, D.O. [facility]  PGY-3 Pager: [phone]  Phone: [phone] Date 04/16/2021  Time 8:25 AM

## 2021-04-16 ENCOUNTER — Encounter: Payer: Self-pay | Admitting: Internal Medicine

## 2021-04-16 ENCOUNTER — Telehealth: Payer: Self-pay | Admitting: Internal Medicine

## 2021-04-16 DIAGNOSIS — B0229 Other postherpetic nervous system involvement: Secondary | ICD-10-CM | POA: Insufficient documentation

## 2021-04-16 LAB — CMP14 + ANION GAP
ALT: 27 IU/L (ref 0–44)
AST: 18 IU/L (ref 0–40)
Albumin/Globulin Ratio: 1.6 (ref 1.2–2.2)
Albumin: 4.3 g/dL (ref 3.8–4.9)
Alkaline Phosphatase: 71 IU/L (ref 44–121)
Anion Gap: 15 mmol/L (ref 10.0–18.0)
BUN/Creatinine Ratio: 17 (ref 9–20)
BUN: 22 mg/dL (ref 6–24)
Bilirubin Total: 0.5 mg/dL (ref 0.0–1.2)
CO2: 25 mmol/L (ref 20–29)
Calcium: 9.6 mg/dL (ref 8.7–10.2)
Chloride: 103 mmol/L (ref 96–106)
Creatinine, Ser: 1.27 mg/dL (ref 0.76–1.27)
Globulin, Total: 2.7 g/dL (ref 1.5–4.5)
Glucose: 130 mg/dL — ABNORMAL HIGH (ref 70–99)
Potassium: 4.8 mmol/L (ref 3.5–5.2)
Sodium: 143 mmol/L (ref 134–144)
Total Protein: 7 g/dL (ref 6.0–8.5)
eGFR: 68 mL/min/{1.73_m2} (ref >59)

## 2021-04-16 NOTE — Assessment & Plan Note (Addendum)
HPI: Patient presents for further evaluation and management of DM.  Patient is currently on metformin 500 mg daily.  He denies any significant side effects from this medication. Hemoglobin A1c today 6.5 which is consistent with his previous results. Has not had an eye exam this year.  Exam performed today showing no significant skin breakdown or peripheral neuropathy.  He does however have hyperkeratosis of multiple toenails and evidence of onychomycosis of the left first digit.  Hemoglobin A1C Latest Ref Rng & Units 04/15/2021 03/23/2021 07/02/2020  HGBA1C 4.0 - 5.6 % 6.5(A) 6.6(A) 5.9(A)  Some recent data might be hidden      Assessment/Plan: Type 2 Diabetes Mellitus: - Continue Metformin 500 mg daily.  -Patient would likely benefit from switching from metformin to a GLP-1 agonist due to weight loss - Referred to podiatry - Foot exam performed today - Consider urine microalbumin at next visit

## 2021-04-16 NOTE — Assessment & Plan Note (Signed)
HPI: Patient presents Evaluation and management is back pain.  He states that he had shingles back in July over his right gluteus on the L2-L3 nerve distribution. Since then he has developed burning and tingling over that area that radiates down his leg.  He denies any systemic signs of infection or chronic back pain that could be contributing to radiculopathy.  Risk features of back pain for impingement.  Patient's symptoms are most consistent with postherpetic neuralgia.   Assessment/Plan: Postherpetic neuralgia: - CMP today to assess kidney function  -Start a trial of gabapentin 300 mg nightly

## 2021-04-16 NOTE — Assessment & Plan Note (Signed)
HPI: Patient presents for further evaluation and management of HTN. He is currently not on hypertensive medications.  He was previously taking losartan 25 mg daily.  Current blood pressures listed below. States that he would like to hold off on restarting this medication right now.   BP Readings from Last 3 Encounters:  04/15/21 135/87  03/23/21 126/83  07/30/20 130/76    Assessment/Plan: Primary hypertension: -Patient will likely need to restart ACE/ARB at subsequent visit in 3 months. -Based on his age and comorbidities, he will likely benefit from strict control of his blood pressure with a goal of less than 130/80 mm Hg.

## 2021-04-16 NOTE — Telephone Encounter (Signed)
Patient called regarding his lab results. He will need to be rescheduled for a 3 month follow up.    Chari Manning, D.O.  Internal Medicine Resident, PGY-3 [facility]  Pager: [phone] 11:05 AM, 04/16/2021

## 2021-04-16 NOTE — Assessment & Plan Note (Signed)
Patient has a history of metabolic liver disease.  Plan: -We will repeat a CMP today for better medication management.

## 2021-04-16 NOTE — Progress Notes (Signed)
Internal Medicine Clinic Attending  Case discussed with Dr. Marchia Bond  At the time of the visit.  We reviewed the resident's history and exam and pertinent patient test results.  I agree with the assessment, diagnosis, and plan of care documented in the resident's note. Agree with referral to podiatry for diabetic foot care and onychomycosis. We will likely avoid systemic antifungal treatment given poor efficacy and history of NAFLD. May consider topical therapies pending podiatry recommendations although again efficacy is typically poor.

## 2021-04-16 NOTE — Assessment & Plan Note (Addendum)
HPI: Patient presents evaluation and managment of chronic changes. He endorses significant changes to the toenail of the left 1st digit.  He denies any pain, redness, swelling, discharge.  On exam there is thickening and irregularity consistent with onychomycosis.   Assessment/Plan: Onychomycosis left first digit -Counseled regarding cleaning foot daily clean/dry socks daily - Will get CMP to assess liver function  - Will need to consider topical tx such as lotrimin or efinaconazole versus alternative oral fluconazole due to history of liver disease - Referred to podiatry

## 2021-04-19 ENCOUNTER — Encounter: Payer: Self-pay | Admitting: Podiatry

## 2021-04-19 ENCOUNTER — Other Ambulatory Visit: Payer: Self-pay

## 2021-04-19 ENCOUNTER — Ambulatory Visit (INDEPENDENT_AMBULATORY_CARE_PROVIDER_SITE_OTHER): Payer: 59 | Admitting: Podiatry

## 2021-04-19 ENCOUNTER — Ambulatory Visit (INDEPENDENT_AMBULATORY_CARE_PROVIDER_SITE_OTHER): Payer: 59

## 2021-04-19 DIAGNOSIS — M7752 Other enthesopathy of left foot: Secondary | ICD-10-CM

## 2021-04-19 DIAGNOSIS — B351 Tinea unguium: Secondary | ICD-10-CM

## 2021-04-19 DIAGNOSIS — M7751 Other enthesopathy of right foot: Secondary | ICD-10-CM

## 2021-04-19 DIAGNOSIS — M779 Enthesopathy, unspecified: Secondary | ICD-10-CM | POA: Diagnosis not present

## 2021-04-19 MED ORDER — TRIAMCINOLONE ACETONIDE 10 MG/ML IJ SUSP
20.0000 mg | Freq: Once | INTRAMUSCULAR | Status: AC
  Administered 2021-04-19: 20 mg

## 2021-04-20 NOTE — Progress Notes (Signed)
Subjective:   Patient ID: Joe Pearson, male   DOB: 50 y.o.   MRN: [id number]   HPI Patient states that he has developed progressive pain in both of his ankles and also has a damaged thickened big toenail left that he has to work with and eventually would consider removal and admits that obesity has been a problem and that he needs to lose weight.  Patient does not smoke likes to be active   Review of Systems  All other systems reviewed and are negative.      Objective:  Physical Exam Vitals and nursing note reviewed.  Constitutional:      Appearance: He is well-developed.  Pulmonary:     Effort: Pulmonary effort is normal.  Musculoskeletal:        General: Normal range of motion.  Skin:    General: Skin is warm.  Neurological:     Mental Status: He is alert.    Neurovascular status intact muscle strength found to be adequate range of motion is adequate.  Patient found to have exquisite discomfort in the sinus tarsi bilateral with inflammation fluid buildup and a thickened damaged left hallux nail which is dystrophic and bothersome for the patient.  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

## 2021-05-05 ENCOUNTER — Telehealth: Payer: Self-pay | Admitting: Internal Medicine

## 2021-05-05 NOTE — Telephone Encounter (Signed)
Patient notified that pantoprazole (PROTONIX) 40 MG tablet #30 with 3 refills was sent to [facility] on 12/1 with receipt confirmed by pharmacy. He will call [facility] now.

## 2021-05-05 NOTE — Telephone Encounter (Signed)
Pt requesting a call back.  Patient states at his LOV he was to have a some medicine called in for his Acid reflux.

## 2021-06-11 ENCOUNTER — Telehealth: Payer: Self-pay | Admitting: Internal Medicine

## 2021-06-11 ENCOUNTER — Telehealth: Payer: Self-pay | Admitting: *Deleted

## 2021-06-11 NOTE — Telephone Encounter (Signed)
Patient called [facility] regarding heart palpitations.  Heart palpitation woke the patient up from sleeping.  He thought his blood pressure was high and took a dose of losartan 25 mg.  This is a medication that he was previously on but seems to have been discontinued.  Patient states that the palpitations subsided but has a lingering headache.  He denies any focal neurologic deficits, chest pain, presyncopal symptoms, nausea, emesis.  I counseled the patient on red flag signs and symptoms and when to go to the ED.  Patient has a follow-up appointment set up for Monday morning.  Likely need an EKG and some basic lab work to further evaluate for an arrhythmia.  Chari Manning, D.O.  Internal Medicine Resident, PGY-3 [facility]  Pager: [phone] 3:47 PM, 06/11/2021   **Please contact the on call pager after 5 pm and on weekends at [phone].**

## 2021-06-11 NOTE — Telephone Encounter (Addendum)
Call from patient went to the ER yesterday for Insomnia.  Had a slightly elevated blood pressure.  Patient said that he then went home and took a Losartan pill that had been prescribed for his blood pressure.  Said that he had an elevated heart rate for a little while and then his his heart rate went back down.  Feels ok now.  No chest pain.  Did not pick up any medications from the ER.  Stated none was given/prescribed for his insomnia.  Patient was given an appointment in the Clinics at 8:45  am to follow up on need for B/P medication.  Also to go th the ER if the elevated heart rate  returns or has chest pain.  States feels ok now and will  not take another Losartan before he sees the doctor in Clinic. °

## 2021-06-11 NOTE — Telephone Encounter (Incomplete Revision)
Call from patient went to the ER yesterday for Insomnia.  Had a slightly elevated blood pressure.  Patient said that he then went home and took a Losartan pill that had been prescribed for his blood pressure.  Said that he had an elevated heart rate for a little while and then his his heart rate went back down.  Feels ok now.  No chest pain.  Did not pick up any medications from the ER.  Stated none was given/prescribed for his insomnia.  Patient was given an appointment in the Clinics at 8:45  am to follow up on need for B/P medication.  Also to go th the ER if the elevated heart rate  returns or has chest pain.  States feels ok now and will  not take another Losartan before he sees the doctor in Clinic.

## 2021-06-14 ENCOUNTER — Encounter: Payer: 59 | Admitting: Internal Medicine

## 2021-06-16 ENCOUNTER — Ambulatory Visit (INDEPENDENT_AMBULATORY_CARE_PROVIDER_SITE_OTHER): Payer: 59 | Admitting: Student

## 2021-06-16 ENCOUNTER — Other Ambulatory Visit: Payer: Self-pay

## 2021-06-16 ENCOUNTER — Encounter: Payer: Self-pay | Admitting: Student

## 2021-06-16 VITALS — BP 138/90 | HR 71 | Temp 98.4°F | Ht 66.0 in | Wt 243.5 lb

## 2021-06-16 DIAGNOSIS — F419 Anxiety disorder, unspecified: Secondary | ICD-10-CM

## 2021-06-16 DIAGNOSIS — I10 Essential (primary) hypertension: Secondary | ICD-10-CM | POA: Diagnosis not present

## 2021-06-16 DIAGNOSIS — J4520 Mild intermittent asthma, uncomplicated: Secondary | ICD-10-CM

## 2021-06-16 DIAGNOSIS — R7989 Other specified abnormal findings of blood chemistry: Secondary | ICD-10-CM | POA: Diagnosis not present

## 2021-06-16 DIAGNOSIS — E1165 Type 2 diabetes mellitus with hyperglycemia: Secondary | ICD-10-CM | POA: Diagnosis not present

## 2021-06-16 DIAGNOSIS — Z0000 Encounter for general adult medical examination without abnormal findings: Secondary | ICD-10-CM

## 2021-06-16 LAB — GLUCOSE, CAPILLARY: Glucose-Capillary: 117 mg/dL — ABNORMAL HIGH (ref 70–99)

## 2021-06-16 MED ORDER — ESCITALOPRAM OXALATE 10 MG PO TABS: 10.0000 mg | ORAL_TABLET | Freq: Every day | ORAL | 2 refills | Status: DC

## 2021-06-16 NOTE — Patient Instructions (Addendum)
Thank you, [name] for allowing [place] to provide your care today. Today we discussed your recent visit to the ER, chest discomfort and anxiety.  I think you likely had a panic attack in the setting of recent stressors in your life.  I am starting you on an anxiety medication and referring you to a therapist.  I have ordered the following labs for you:  Lab Orders         Hepatitis C Ab reflex to Quant PCR         HIV antibody (with reflex)         TSH         Microalbumin / Creatinine Urine Ratio         POCT CBG (Fasting - Glucose)       I will call if any are abnormal. All of your labs can be accessed through "My Chart".  I have place a referrals to Dr. Monna Fam for counseling/therapy  I have ordered the following tests:   I have ordered the following medication/changed the following medications:  Start Lexapro 10 mg daily  My Chart Access: [email or web address]  Please follow-up with me in 3 to 4 weeks  Please make sure to arrive 15 minutes prior to your next appointment. If you arrive late, you may be asked to reschedule.    We look forward to seeing you next time. Please call our clinic at [phone] if you have any questions or concerns. The best time to call is Monday-Friday from 9am-4pm, but there is someone available 24/7. If after hours or the weekend, call the main hospital number and ask for the Internal Medicine Resident On-Call. If you need medication refills, please notify your pharmacy one week in advance and they will send [place] a request.   Thank you for letting us take part in your care. Wishing you the best!  Steffanie Rainwater, MD 06/16/2021, 3:07 PM IM Resident, PGY-2 [facility] 41:10

## 2021-06-16 NOTE — Progress Notes (Signed)
° °  CC: ER follow-up  HPI:  Mr.Malak ALF DOYLE is a 51 y.o. male with PMH as below who presents to clinic after recent visit to [facility] ER. Please see problem based charting for evaluation, assessment and plan.  Past Medical History:  Diagnosis Date   COVID-19 virus vaccine not available 02/05/2020   NAFLD (nonalcoholic fatty liver disease)     Review of Systems:  Constitutional: Negative for fever or fatigue Eyes: Negative for visual changes Respiratory: Negative for shortness of breath Cardiac: Positive for palpitations. Negative for chest pain MSK: Negative for back pain Neuro: Negative for headache or weakness Psych: Positive for anxiety  Physical Exam: General: Pleasant, obese middle-age man. No acute distress. Cardiac: RRR. No murmurs, rubs or gallops. No LE edema Respiratory: Lungs CTAB. No wheezing or crackles. Abdominal: Soft, symmetric and non tender. Normal BS. Skin: Warm, dry and intact without rashes or lesions Extremities: Atraumatic. Full ROM. Palpable radial and DP pulses. Neuro: A&O x 3. Moves all extremities Psych: Appropriate affect. Anxious mood.  Vitals:   06/16/21 1353  BP: 138/90  Pulse: 71  Temp: 98.4 F (36.9 C)  TempSrc: Oral  SpO2: 99%  Weight: 243 lb 7.5 oz (110.4 kg)  Height: 5\' 6"  (1.676 m)     Assessment & Plan:   See Encounters Tab for problem based charting.  Patient discussed with Dr. , MD, MPH

## 2021-06-17 ENCOUNTER — Encounter: Payer: Self-pay | Admitting: Student

## 2021-06-17 DIAGNOSIS — F419 Anxiety disorder, unspecified: Secondary | ICD-10-CM | POA: Insufficient documentation

## 2021-06-17 LAB — MICROALBUMIN / CREATININE URINE RATIO
Creatinine, Urine: 164.1 mg/dL
Microalb/Creat Ratio: 2 mg/g creat (ref 0–29)
Microalbumin, Urine: 3.5 ug/mL

## 2021-06-17 LAB — TSH: TSH: 2.44 u[IU]/mL (ref 0.450–4.500)

## 2021-06-17 LAB — HIV ANTIBODY (ROUTINE TESTING W REFLEX): HIV Screen 4th Generation wRfx: NONREACTIVE

## 2021-06-17 LAB — HCV AB W REFLEX TO QUANT PCR: HCV Ab: 0.1 s/co ratio (ref 0.0–0.9)

## 2021-06-17 LAB — HCV INTERPRETATION

## 2021-06-17 MED ORDER — ALBUTEROL SULFATE HFA 108 (90 BASE) MCG/ACT IN AERS: 1.0000 | INHALATION_SPRAY | RESPIRATORY_TRACT | 1 refills | Status: DC | PRN

## 2021-06-17 NOTE — Progress Notes (Signed)
Internal Medicine Clinic Attending  Case discussed with Dr. Amponsah  At the time of the visit.  We reviewed the resident's history and exam and pertinent patient test results.  I agree with the assessment, diagnosis, and plan of care documented in the resident's note.  

## 2021-06-17 NOTE — Assessment & Plan Note (Signed)
Patient was recently evaluated at [facility] ER for multiple complaints including anxiety, insomnia and some palpitations.  Work-up including labs, head CT, EKG were unremarkable. Reports being under a lot of stress lately due to his daughter's legal problems. Two months ago, patient's daughter was in [place] when one of her friends died of alcohol poisoning and she is being threatened and targeted by many people and being blamed even though the [place] police released her back to the [place]. He has had difficulty sleeping and became very nervous after noticing significantly high blood pressure at a friend's house. He denies any shortness of breath, dizziness, chest pain or new palpitations.  Symptoms likely not cardiac related in the setting of normal HR and EKG.  TSH wnl.  Patient's symptoms likely secondary to adjustment disorder in the setting of recent stressors versus generalized anxiety disorder. He reports that he has difficult time relaxing and has trouble sleeping.  He would like something to help relax him and he is willing to see a therapist.  Plan: -- Start Lexapro 10 mg daily -- Referral to [facility] for CBT with Dr. Monna Fam -- Follow-up in 3-4 weeks

## 2021-06-17 NOTE — Assessment & Plan Note (Signed)
TSH within normal limits

## 2021-06-17 NOTE — Assessment & Plan Note (Signed)
Diabetes well controlled.  Wanted his blood sugar checked today.  CBG within normal limits. -- Urine microalbumin creatinine ratio pending

## 2021-06-17 NOTE — Assessment & Plan Note (Signed)
Patient reports he stopped taking his losartan 25 mg daily due to reading about the side effects. He took this medication last week after noticing that his BP was elevated at her friend's house. Reports an episode of palpitation that night and does not want to go back on that medication. Patient reported being under a lot of stress which is likely contributing to his elevated BP and palpitations. BP is 138/90 today.  Vitals:   06/16/21 1353  BP: 138/90    Plan: -- Discuss starting another hypertensive agent at next office visit

## 2021-06-17 NOTE — Assessment & Plan Note (Signed)
Negative HIV screening today.  Hepatitis C screening pending

## 2021-07-01 ENCOUNTER — Encounter: Payer: Self-pay | Admitting: Internal Medicine

## 2021-07-02 ENCOUNTER — Other Ambulatory Visit: Payer: Self-pay

## 2021-07-02 ENCOUNTER — Ambulatory Visit (INDEPENDENT_AMBULATORY_CARE_PROVIDER_SITE_OTHER): Payer: 59 | Admitting: Internal Medicine

## 2021-07-02 ENCOUNTER — Telehealth: Payer: Self-pay

## 2021-07-02 DIAGNOSIS — U071 COVID-19: Secondary | ICD-10-CM

## 2021-07-02 NOTE — Assessment & Plan Note (Signed)
Patient is presenting with three days of headaches, congestion, intermittently productive cough of yellow sputum, facial pressure, subjective fevers/chills with night sweats, mild lightheadedness/dizziness. He is tolerating oral intake but has developed nausea since yesterday and episodes of diarrhea. Denies any chest pain, shortness of breath or abdominal pain. He tested positive for COVID last night. He has been taking Advil to help with the headaches. Patient advised for continued supportive care with Tylenol/Advil scheduled and OTC cough suppressants. He was given precautions of presenting to the ED if he feels significantly dyspneic, persistent fevers or unable to maintain oral hydration. Will continue to monitor.

## 2021-07-02 NOTE — Telephone Encounter (Signed)
Pt is requesting a call back .. he stated that he took 2 at home covid test both came back as positive.. he is wanting to know if his PCP can send him in something to the pharmacy .Marland Kitchen

## 2021-07-02 NOTE — Telephone Encounter (Signed)
Returned call to patient. States he began with sx 3 days ago. C/o H/A, chest congestion, cough, intermittently productive of yellow sputum, facial pressure, feels warm (no thermometer), chills. Has not had any vaccines for Covid. Lives alone, no sick contacts. He has had 2 positive Covid tests. Tele appt given today at 1500.

## 2021-07-02 NOTE — Progress Notes (Signed)
°  [facility]idency Telephone Encounter Continuity Care Appointment  HPI:  This telephone encounter was created for [name] on 07/02/2021 for the following purpose/cc COVID symptoms.   Past Medical History:  Past Medical History:  Diagnosis Date   COVID-19 virus vaccine not available 02/05/2020   NAFLD (nonalcoholic fatty liver disease)      ROS:  Negative except as stated in HPI   Assessment / Plan / Recommendations:  Please see A&P under problem oriented charting for assessment of the patient's acute and chronic medical conditions.  As always, pt is advised that if symptoms worsen or new symptoms arise, they should go to an urgent care facility or to to ER for further evaluation.   Consent and Medical Decision Making:  Patient discussed with Dr. Oswaldo Done This is a telephone encounter between Joe Pearson and Joe Pearson on 07/02/2021 for COVID symptoms. The visit was conducted with the patient located at home and [place] at [facility]. The patient's identity was confirmed using their DOB and current address. The patient has consented to being evaluated through a telephone encounter and understands the associated risks (an examination cannot be done and the patient may need to come in for an appointment) / benefits (allows the patient to remain at home, decreasing exposure to coronavirus). I personally spent 14 minutes on medical discussion.

## 2021-07-05 NOTE — Progress Notes (Signed)
Internal Medicine Clinic Attending  Case discussed with Dr. Aslam  At the time of the visit.  We reviewed the resident's history and pertinent patient test results.  I agree with the assessment, diagnosis, and plan of care documented in the resident's note.  

## 2021-07-05 NOTE — Addendum Note (Signed)
Addended by: Erlinda Hong T on: 07/05/2021 08:17 AM   Modules accepted: Level of Service

## 2021-07-12 LAB — HM DIABETES EYE EXAM

## 2021-07-19 ENCOUNTER — Other Ambulatory Visit: Payer: Self-pay | Admitting: Student

## 2021-07-22 ENCOUNTER — Telehealth: Payer: Self-pay | Admitting: *Deleted

## 2021-07-22 ENCOUNTER — Ambulatory Visit: Payer: 59 | Admitting: Behavioral Health

## 2021-07-22 DIAGNOSIS — Z639 Problem related to primary support group, unspecified: Secondary | ICD-10-CM

## 2021-07-22 DIAGNOSIS — F331 Major depressive disorder, recurrent, moderate: Secondary | ICD-10-CM

## 2021-07-22 DIAGNOSIS — F419 Anxiety disorder, unspecified: Secondary | ICD-10-CM

## 2021-07-22 NOTE — Telephone Encounter (Signed)
Patient called in c/o nausea, diarrhea, and "stomach hurting all the time." States this started Sept 2022 when he started metformin. Also, states he's gained "a lot of weight since end of last year." Patient is due for 3-4 week f/u. Appt given for tomorrow with Red Team. ?
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
## 2021-07-23 ENCOUNTER — Encounter: Payer: 59 | Admitting: Internal Medicine

## 2021-07-23 ENCOUNTER — Encounter: Payer: Self-pay | Admitting: Internal Medicine

## 2021-07-23 NOTE — Progress Notes (Deleted)
N/D/ stomach hurting all the time that started in Sept 22 ? ?Family stress- ED evaluation 1/26 ?Adjustment disorder ?-started lexapro 10 mg 2/1 ?-following with Dr. Monna Fam ? ?HTN ?Not currently taking any medications for this ? ?COVID+ 3 weeks ago ? ?Care gaps: ?Colonoscopy ?tetanus ?

## 2021-07-29 ENCOUNTER — Encounter: Payer: Self-pay | Admitting: Internal Medicine

## 2021-07-29 ENCOUNTER — Ambulatory Visit (INDEPENDENT_AMBULATORY_CARE_PROVIDER_SITE_OTHER): Payer: 59 | Admitting: Internal Medicine

## 2021-07-29 ENCOUNTER — Other Ambulatory Visit: Payer: Self-pay

## 2021-07-29 VITALS — BP 139/89 | HR 73 | Temp 98.3°F | Ht 66.0 in | Wt 245.8 lb

## 2021-07-29 DIAGNOSIS — R002 Palpitations: Secondary | ICD-10-CM

## 2021-07-29 DIAGNOSIS — F419 Anxiety disorder, unspecified: Secondary | ICD-10-CM

## 2021-07-29 DIAGNOSIS — I10 Essential (primary) hypertension: Secondary | ICD-10-CM

## 2021-07-29 DIAGNOSIS — F32A Depression, unspecified: Secondary | ICD-10-CM

## 2021-07-29 DIAGNOSIS — E1165 Type 2 diabetes mellitus with hyperglycemia: Secondary | ICD-10-CM | POA: Diagnosis not present

## 2021-07-29 DIAGNOSIS — Z1211 Encounter for screening for malignant neoplasm of colon: Secondary | ICD-10-CM | POA: Diagnosis not present

## 2021-07-29 DIAGNOSIS — Z0000 Encounter for general adult medical examination without abnormal findings: Secondary | ICD-10-CM

## 2021-07-29 MED ORDER — AMLODIPINE BESYLATE 5 MG PO TABS: 5.0000 mg | ORAL_TABLET | Freq: Every day | ORAL | 11 refills | Status: DC

## 2021-07-29 MED ORDER — HYDROXYZINE HCL 10 MG PO TABS: ORAL_TABLET | ORAL | 0 refills | Status: DC

## 2021-07-29 MED ORDER — HYDROXYZINE HCL 10 MG PO TABS: 10.0000 mg | ORAL_TABLET | Freq: Three times a day (TID) | ORAL | 0 refills | Status: DC | PRN

## 2021-07-29 MED ORDER — SEMAGLUTIDE(0.25 OR 0.5MG/DOS) 2 MG/1.5ML ~~LOC~~ SOPN: 0.2500 mg | PEN_INJECTOR | SUBCUTANEOUS | 0 refills | Status: DC

## 2021-07-29 NOTE — Patient Instructions (Signed)
[name], it was a pleasure seeing you today! ? ?Today we discussed: ?Diabetes ?Please discontinue metformin. I am sending in a medication called Ozempic. This is a 1 time weekly injection that is good for diabetes and weight loss. Please follow-up in 4 weeks and if you are doing well on medication we can increase dose at that time. ? ?Blood pressure ?Your blood pressure was elevated in clinic. Please start taking amlodipine 5 mg 1 time daily. Follow-up in 4 weeks. If you have more episodes of palpitations please let us know. ? ?Depression ?A lot of the symptoms we talked about today are relating to your sleep. I am sending in a medication called atarax. You can take this medication when you are having trouble going to sleep. ? ?Colon cancer screening ?Please bring in stool sample. If this is positive then you will need colonoscopy. ? ?I have ordered the following labs today: ? ?Lab Orders    ?     Fecal occult blood, imunochemical    ?  ? ?I will call if any are abnormal. All of your labs can be accessed through "My Chart" ?  ?My Chart Access: ?[email or web address] ? ? ?Referrals ordered today:  ? ?Referral Orders  ?No referral(s) requested today  ?  ? ?I have ordered the following medication/changed the following medications:  ? ?Stop the following medications: ?Medications Discontinued During This Encounter  ?Medication Reason  ? metFORMIN (GLUCOPHAGE XR) 500 MG 24 hr tablet Change in therapy  ? escitalopram (LEXAPRO) 10 MG tablet Change in therapy  ?  ? ?Start the following medications: ?Meds ordered this encounter  ?Medications  ? Semaglutide,0.25 or 0.5MG /DOS, 2 MG/1.5ML SOPN  ?  Sig: Inject 0.25 mg into the skin once a week.  ?  Dispense:  1.5 mL  ?  Refill:  0  ? amLODipine (NORVASC) 5 MG tablet  ?  Sig: Take 1 tablet (5 mg total) by mouth daily.  ?  Dispense:  30 tablet  ?  Refill:  11  ? hydrOXYzine (ATARAX) 10 MG tablet  ?  Sig: Take 1 tablet (10 mg  total) by mouth 3 (three) times daily as needed.  ?  Dispense:  30 tablet  ?  Refill:  0  ?  ? ?Follow-up:  4 weeks   ? ?Please make sure to arrive 15 minutes prior to your next appointment. If you arrive late, you may be asked to reschedule.  ? ?We look forward to seeing you next time. Please call our clinic at [phone] if you have any questions or concerns. The best time to call is Monday-Friday from 9am-4pm, but there is someone available 24/7. If after hours or the weekend, call the main hospital number and ask for the Internal Medicine Resident On-Call. If you need medication refills, please notify your pharmacy one week in advance and they will send [place] a request. ? ?Thank you for letting us take part in your care. Wishing you the best! ? ?Thank you, ?Dr. Sloan Leiter ?[facility]  ?

## 2021-07-29 NOTE — Progress Notes (Signed)
? ? ?Subjective:  ?CC: Diabetes, HTN, anxiety ? ?HPI: ? ?[name] is a 51 y.o. male with a past medical history stated below and presents today for difficulty with diabetic medication, hypertension, and depression. Please see problem based assessment and plan for additional details. ? ?Past Medical History:  ?Diagnosis Date  ? COVID-19 virus vaccine not available 02/05/2020  ? NAFLD (nonalcoholic fatty liver disease)   ? ? ?Current Outpatient Medications on File Prior to Visit  ?Medication Sig Dispense Refill  ? albuterol (VENTOLIN HFA) 108 (90 Base) MCG/ACT inhaler Inhale 1-2 puffs into the lungs every 4 (four) hours as needed for wheezing or shortness of breath. 6.7 g 1  ? fluticasone (FLONASE) 50 MCG/ACT nasal spray Place 1 spray into both nostrils daily. 9.9 mL 2  ? gabapentin (NEURONTIN) 300 MG capsule Take 1 capsule (300 mg total) by mouth daily for 60 doses. 60 capsule 0  ? pantoprazole (PROTONIX) 40 MG tablet Take 1 tablet (40 mg total) by mouth daily. 30 tablet 3  ? rosuvastatin (CRESTOR) 10 MG tablet Take 1 tablet (10 mg total) by mouth daily. 30 tablet 11  ? ?No current facility-administered medications on file prior to visit.  ? ? ?Family History  ?Problem Relation Age of Onset  ? Cancer Mother   ?     breast  ? Sudden death Neg Hx   ? Hyperlipidemia Neg Hx   ? Hypertension Neg Hx   ? Heart attack Neg Hx   ? Diabetes Neg Hx   ? ? ?Social History  ? ?Socioeconomic History  ? Marital status: Single  ?  Spouse name: Not on file  ? Number of children: Not on file  ? Years of education: Not on file  ? Highest education level: Not on file  ?Occupational History  ? Not on file  ?Tobacco Use  ? Smoking status: Never  ? Smokeless tobacco: Never  ?Substance and Sexual Activity  ? Alcohol use: No  ?  Alcohol/week: 0.0 standard drinks  ? Drug use: No  ? Sexual activity: Not on file  ?Other Topics Concern  ? Not on file  ?Social History Narrative  ? Not on file  ? ?Social Determinants of Health   ? ?Financial Resource Strain: Not on file  ?Food Insecurity: Not on file  ?Transportation Needs: Not on file  ?Physical Activity: Not on file  ?Stress: Not on file  ?Social Connections: Not on file  ?Intimate Partner Violence: Not on file  ? ? ?Review of Systems: ?ROS negative except for what is noted on the assessment and plan. ? ?Objective:  ? ?Vitals:  ? 07/29/21 0918  ?BP: 139/89  ?Pulse: 73  ?Temp: 98.3 ?F (36.8 ?C)  ?TempSrc: Oral  ?SpO2: 100%  ?Weight: 245 lb 12.8 oz (111.5 kg)  ?Height: 5\' 6"  (1.676 m)  ? ? ?Physical Exam: ?Gen: A&O x3 and in no apparent distress, well appearing and nourished. ?Neck: no masses or nodules, AROM intact. ?CV: RRR, no murmurs, S1/S2 presents  ?Resp: Clear to ascultation bilaterally  ?Abd: BS (+) x4, soft, non-tender abdomen, without hepatosplenomegaly or masses ?MSK: Grossly normal AROM and strength x4 extremities. ?Skin: good skin turgor, no rashes, unusual bruising, or prominent lesions.  ?Neuro: No focal deficits, grossly normal sensation and coordination.  ?Psych: Oriented x3 and responding appropriately. Intact memory, normal mood, judgement, affect, and insight.  ? ? ?Assessment & Plan:  ?Type 2 diabetes mellitus (HCC) ?Patient with history of well-controlled type 2 diabetes. He states that  since he started taking metformin ER in September of 2022, he has had diarrhea almost daily following taking medication. Last Hgb A1c at 6.5 12/22. He endorses polyuria, denies polydipsia. He states that he wakes up to pee several times nightly, but this has been an ongoing problem since being diagnosed with diabetes. Denies straining to void, no trouble emptying bladder, no incontinence. ?A/P: ?A1c is well controlled. Spoke with patient about discontinuing agent and not necessarily needing to be on diabetic agent at this time. He states that he is interested in trying injectable medication that might help him with weight loss. ?Discontinue metformin ?Start Ozempic at 0.25mg  weekly,  f/u in 4 weeks ? ?Healthcare maintenance ?Patient was given FOBT today with instructions. ? ?Hypertension ?Blood pressure mildly elevated at 139/89. He does not check his blood pressure at home. He previously stopped taking losartan due to concern it was causing palpitations. Micro/albumin ratio within normal limits 2/23. ?A/p: ?Start amlodipine 5 mg ?F/u in 4 weeks ? ?Palpitations ?He previously stopped taking losartan due to concern it was causing palpitations. He has not had further palpitations since stopping medication. He states that he woke up from sleep due to his heart racing, denies chest pain and that time, but states he felt a little short of breath. Prior EKG normal sinus rhythm. On exam his heart sounds a regular rate and rhythm. TSH within normal limits in 2/23. Asked patient to let us know if he has further episodes. ? ?Anxiety ?Not taking lexapro, PHQ-9 score at 8 with most of symptoms relating to sleep. He states that he goes to bed and wakes up an hour later and has difficulty going back to sleep from thinking. He follows with Dr. Monna Fam. ?A/P: ?Hydroxyzine 10 mg nightly PRN ?F/u in 4 weeks  ? ?Patient discussed with Dr. Sol Blazing ? ? ?Rudene Christians, D.O. ?[facility]  PGY-1 ?Pager: [phone]  Phone: [phone] ?Date 07/30/2021  Time 6:58 AM  ? ?

## 2021-07-30 DIAGNOSIS — R002 Palpitations: Secondary | ICD-10-CM | POA: Insufficient documentation

## 2021-07-30 NOTE — Assessment & Plan Note (Signed)
He previously stopped taking losartan due to concern it was causing palpitations. He has not had further palpitations since stopping medication. He states that he woke up from sleep due to his heart racing, denies chest pain and that time, but states he felt a little short of breath. Prior EKG normal sinus rhythm. On exam his heart sounds a regular rate and rhythm. TSH within normal limits in 2/23. Asked patient to let us know if he has further episodes. ?

## 2021-07-30 NOTE — Assessment & Plan Note (Signed)
Patient was given FOBT today with instructions. ?

## 2021-07-30 NOTE — Assessment & Plan Note (Signed)
Not taking lexapro, PHQ-9 score at 8 with most of symptoms relating to sleep. He states that he goes to bed and wakes up an hour later and has difficulty going back to sleep from thinking. He follows with Dr. Monna Fam. ?A/P: ?Hydroxyzine 10 mg nightly PRN ?F/u in 4 weeks ?

## 2021-07-30 NOTE — Assessment & Plan Note (Addendum)
Blood pressure mildly elevated at 139/89. He does not check his blood pressure at home. He previously stopped taking losartan due to concern it was causing palpitations. Micro/albumin ratio within normal limits 2/23. ?A/p: ?Start amlodipine 5 mg ?F/u in 4 weeks ?

## 2021-07-30 NOTE — Assessment & Plan Note (Signed)
Patient with history of well-controlled type 2 diabetes. He states that since he started taking metformin ER in September of 2022, he has had diarrhea almost daily following taking medication. Last Hgb A1c at 6.5 12/22. He endorses polyuria, denies polydipsia. He states that he wakes up to pee several times nightly, but this has been an ongoing problem since being diagnosed with diabetes. Denies straining to void, no trouble emptying bladder, no incontinence. ?A/P: ?A1c is well controlled. Spoke with patient about discontinuing agent and not necessarily needing to be on diabetic agent at this time. He states that he is interested in trying injectable medication that might help him with weight loss. ?Discontinue metformin ?Start Ozempic at 0.25mg  weekly, f/u in 4 weeks ?

## 2021-07-30 NOTE — Progress Notes (Signed)
Internal Medicine Clinic Attending  Case discussed with Dr. Masters  At the time of the visit.  We reviewed the resident's history and exam and pertinent patient test results.  I agree with the assessment, diagnosis, and plan of care documented in the resident's note.  

## 2021-08-16 ENCOUNTER — Encounter: Payer: Self-pay | Admitting: Internal Medicine

## 2021-08-16 ENCOUNTER — Ambulatory Visit (INDEPENDENT_AMBULATORY_CARE_PROVIDER_SITE_OTHER): Payer: 59 | Admitting: Internal Medicine

## 2021-08-16 VITALS — BP 130/87 | HR 82 | Temp 98.4°F | Ht 66.0 in | Wt 246.9 lb

## 2021-08-16 DIAGNOSIS — J302 Other seasonal allergic rhinitis: Secondary | ICD-10-CM | POA: Diagnosis not present

## 2021-08-16 DIAGNOSIS — H5712 Ocular pain, left eye: Secondary | ICD-10-CM | POA: Insufficient documentation

## 2021-08-16 DIAGNOSIS — H538 Other visual disturbances: Secondary | ICD-10-CM

## 2021-08-16 DIAGNOSIS — E1165 Type 2 diabetes mellitus with hyperglycemia: Secondary | ICD-10-CM | POA: Diagnosis not present

## 2021-08-16 LAB — POCT GLYCOSYLATED HEMOGLOBIN (HGB A1C): Hemoglobin A1C: 7.3 % — AB (ref 4.0–5.6)

## 2021-08-16 LAB — GLUCOSE, CAPILLARY: Glucose-Capillary: 190 mg/dL — ABNORMAL HIGH (ref 70–99)

## 2021-08-16 MED ORDER — KETOTIFEN FUMARATE 0.025 % OP SOLN: 1.0000 [drp] | Freq: Two times a day (BID) | OPHTHALMIC | 0 refills | Status: AC

## 2021-08-16 NOTE — Assessment & Plan Note (Addendum)
Patient has diabetes that has been well-controlled. He was taking Metformin, but this was discontinued 07/2021, and Ozempic was started. His most recent A1c 4 months ago was 6.5. He had a diabetic retinal exam earlier this year and foot exam. He was having frequent diarrhea on Metformin, but this reduced over time. Since starting Ozempic last week, he has had frequent diarrhea and cramping abdominal pain across right and left lower quadrants mostly in the mornings and evenings. The pain is not worse with eating or lying down. He says the pain is relieved when he has diarrhea, and he has had no constipation or urinary changes. He has no history of abdominal surgeries and denied all other symptoms including fever, chest pain, SOB, nausea. On exam, abdomen was distended and firm, but there was no pain to palpation and active bowel sounds were heard. Reassured patient that GI changes are common when beginning the Ozempic injections, and that these would resolve over time. ? ?Plan ?- Continue Ozempic 0.25mg  once weekly injections ?- Will send in a prescription for 0.5 mg dosage to start after he finishes a month of 0.25 mg.  ?- Stay on 0.5 mg dosage until repeat A1c in 3 months ?- Follow up A1c results ?

## 2021-08-16 NOTE — Progress Notes (Signed)
? ?This is a Psychologist, occupational Note.  The care of the patient was discussed with Dr. Dellia Cloud, MD and the assessment and plan was formulated with their assistance.  Please see their note for official documentation of the patient encounter.  ? ?Subjective:  ? ?Patient ID: Joe Pearson male   DOB: 04-28-1971 51 y.o.   MRN: [id number] ? ?HPI: ?[name] is a 51 y.o. with PMHx of NAFLD and TIIDM who presents with L eye pain and abdominal cramping. ? ?Please see problem-based list for further details, assessments, and plans. ? ?Past Medical History:  ?Diagnosis Date  ? COVID-19 virus vaccine not available 02/05/2020  ? NAFLD (nonalcoholic fatty liver disease)   ? ?Current Outpatient Medications  ?Medication Sig Dispense Refill  ? ketotifen (ALAWAY) 0.025 % ophthalmic solution Place 1 drop into the left eye 2 (two) times daily. 5 mL 0  ? albuterol (VENTOLIN HFA) 108 (90 Base) MCG/ACT inhaler Inhale 1-2 puffs into the lungs every 4 (four) hours as needed for wheezing or shortness of breath. 6.7 g 1  ? amLODipine (NORVASC) 5 MG tablet Take 1 tablet (5 mg total) by mouth daily. 30 tablet 11  ? fluticasone (FLONASE) 50 MCG/ACT nasal spray Place 1 spray into both nostrils daily. 9.9 mL 2  ? gabapentin (NEURONTIN) 300 MG capsule Take 1 capsule (300 mg total) by mouth daily for 60 doses. 60 capsule 0  ? hydrOXYzine (ATARAX) 10 MG tablet Take this at night before bed if having trouble sleeping 30 tablet 0  ? pantoprazole (PROTONIX) 40 MG tablet Take 1 tablet (40 mg total) by mouth daily. 30 tablet 3  ? rosuvastatin (CRESTOR) 10 MG tablet Take 1 tablet (10 mg total) by mouth daily. 30 tablet 11  ? Semaglutide,0.25 or 0.5MG /DOS, 2 MG/1.5ML SOPN Inject 0.25 mg into the skin once a week. 1.5 mL 0  ? ?No current facility-administered medications for this visit.  ? ?Family History  ?Problem Relation Age of Onset  ? Cancer Mother   ?     breast  ? Sudden death Neg Hx   ? Hyperlipidemia Neg Hx   ? Hypertension Neg Hx   ?  Heart attack Neg Hx   ? Diabetes Neg Hx   ? ?Social History  ? ?Socioeconomic History  ? Marital status: Single  ?  Spouse name: Not on file  ? Number of children: Not on file  ? Years of education: Not on file  ? Highest education level: Not on file  ?Occupational History  ? Not on file  ?Tobacco Use  ? Smoking status: Never  ? Smokeless tobacco: Never  ?Substance and Sexual Activity  ? Alcohol use: No  ?  Alcohol/week: 0.0 standard drinks  ? Drug use: No  ? Sexual activity: Not on file  ?Other Topics Concern  ? Not on file  ?Social History Narrative  ? Not on file  ? ?Social Determinants of Health  ? ?Financial Resource Strain: Not on file  ?Food Insecurity: Not on file  ?Transportation Needs: Not on file  ?Physical Activity: Not on file  ?Stress: Not on file  ?Social Connections: Not on file  ? ?Review of Systems: ?Pertinent items are noted in HPI. ?Objective:  ?Physical Exam: ?Vitals:  ? 08/16/21 1505  ?BP: 130/87  ?Pulse: 82  ?Temp: 98.4 ?F (36.9 ?C)  ?TempSrc: Oral  ?Weight: 246 lb 14.4 oz (112 kg)  ?Height: 5\' 6"  (1.676 m)  ? ?BP 130/87 (BP Location: Left Arm, Patient Position:  Sitting, Cuff Size: Normal)   Pulse 82   Temp 98.4 ?F (36.9 ?C) (Oral)   Ht 5\' 6"  (1.676 m)   Wt 246 lb 14.4 oz (112 kg)   BMI 39.85 kg/m?  ? ?General Appearance:    Alert, cooperative, no distress  ?Head:    Normocephalic, atraumatic  ?Eyes:    PERRL, conjunctiva/corneas clear, EOM's intact, no abrasions or ulcerations seen     ?Throat:   Lips, mucosa, and tongue normal  ?Lungs:     Clear to auscultation bilaterally, respirations unlabored  ?Heart:    Regular rate and rhythm, S1 and S2 normal, no murmur, rub   or gallop  ?Abdomen:     Distended, non-tender, bowel sounds active all four quadrants, no masses, no organomegaly  ?Skin:   Skin color, texture, turgor normal, no rashes or lesions  ? ?Assessment & Plan:  ? ?See encounters tab for problem-based charting. ? ?Acute left eye pain ?Patient has had pain and itching of L eye for  past 4 days. He feels like there is sand in it, specifically in the medial corner of the eye closest to the nose. He feels dryness constantly and has not scratched his eye but feels constant itching. He says pollen has aggravated him more this year[place] but he has not taken any allergy medications. He also did not try any topical creams, medications, or eyedrops. He has not had a runny nose or cough, and he has chronic vision blurriness but believes it has become worse in his L eye. On exam, no difficulty using extraocular muscles for eye movements, and visual fields and accommodation intact. No conjunctival injection or eyelid swelling noted. Most likely patient has eye irritation due to seasonal allergies. Discussed possibility of corneal abrasion, but denies risk factors such as contact use, debris, eye-scratching. Believe patient could benefit from lubricating allergy eye drops, and because the patient was told at retinal exam earlier this year that he would be referred to an eye doctor, his vision is blurry, and patient has new pain, will place a referral today. ? ?Plan ?- Alaway 1 drop into the L eye twice daily ?- Referral for eye exam placed ? ?Type 2 diabetes mellitus (HCC) ?Patient has diabetes that has been well-controlled. He was taking Metformin, but this was discontinued 07/2021, and Ozempic was started. His most recent A1c 4 months ago was 6.5. He had a diabetic retinal exam earlier this year and foot exam. He was having frequent diarrhea on Metformin, but this reduced over time. Since starting Ozempic last week, he has had frequent diarrhea and cramping abdominal pain across right and left lower quadrants mostly in the mornings and evenings. The pain is not worse with eating or lying down. He says the pain is relieved when he has diarrhea, and he has had no constipation or urinary changes. He has no history of abdominal surgeries and denied all other symptoms including fever,  chest pain, SOB, nausea. On exam, abdomen was distended and firm, but there was no pain to palpation and active bowel sounds were heard. Reassured patient that GI changes are common when beginning the Ozempic injections, and that these would resolve over time. ? ?Plan ?- Continue Ozempic 0.25mg  once weekly injections ?- Follow up A1c results ? ?

## 2021-08-16 NOTE — Progress Notes (Signed)
Attestation for Student Documentation: ? ?I personally was present and performed or re-performed the history, physical exam and medical decision-making activities of this service and have verified that the service and findings are accurately documented in the student?s note. ? ?Dellia Cloud, MD ?08/17/2021, 10:10 AM ? ?

## 2021-08-16 NOTE — Assessment & Plan Note (Addendum)
Patient has had pain and itching of L eye for past 4 days. He feels like there is sand in it, specifically in the medial corner of the eye closest to the nose. He feels dryness constantly and has not scratched his eye but feels constant itching. He says pollen has aggravated him more this year, [place] but he has not taken any allergy medications. He also did not try any topical creams, medications, or eyedrops. He has not had a runny nose or cough, and he has chronic vision blurriness but believes it has become worse in his L eye. On exam, no difficulty using extraocular muscles for eye movements, and visual fields and accommodation intact. No conjunctival injection or eyelid swelling noted. Most likely patient has eye irritation due to seasonal allergies. Discussed possibility of corneal abrasion, but denies risk factors such as contact use, debris, eye-scratching. Believe patient could benefit from lubricating allergy eye drops, and because the patient was told at retinal exam earlier this year that he would be referred to an eye doctor, his vision is blurry, and patient has new pain, will place a referral today. ? ?Plan ?- Alaway 1 drop into the L eye twice daily ?- Referral for eye exam placed ?

## 2021-08-16 NOTE — Patient Instructions (Signed)
Thank you, [name] for allowing [place] to provide your care today. Today we discussed eye pain, diabetes, eye doctor referral.   ? ?Labs/Tests Ordered: ?Lab Orders    ?     Glucose, capillary    ?     POC Hbg A1C     ? ?Referrals Ordered:  ?Referral Orders    ?     Ambulatory referral to Ophthalmology    ?  ? ?Medication Changes:  ?There are no discontinued medications.  ? ?Meds ordered this encounter  ?Medications  ? ketotifen (ALAWAY) 0.025 % ophthalmic solution  ?  Sig: Place 1 drop into the left eye 2 (two) times daily.  ?  Dispense:  5 mL  ?  Refill:  0  ?  ? ?Health Maintenance Screening: ?Diabetes Health Maintenance Due  ?Topic Date Due  ? OPHTHALMOLOGY EXAM  Never done  ? HEMOGLOBIN A1C  02/15/2022  ? FOOT EXAM  06/16/2022  ? URINE MICROALBUMIN  06/16/2022  ?  ? ?Instructions:  ? ?Follow up: 3 months  ? ?Remember: If you have any questions or concerns, call our clinic at [phone] or after hours call [phone] and ask for the internal medicine resident on call. ? ?Joe Pearson, D.O. ?[place] ? ? ? ?

## 2021-08-17 ENCOUNTER — Encounter: Payer: Self-pay | Admitting: Internal Medicine

## 2021-08-17 MED ORDER — OZEMPIC (0.25 OR 0.5 MG/DOSE) 2 MG/1.5ML ~~LOC~~ SOPN: 0.5000 mg | PEN_INJECTOR | SUBCUTANEOUS | 2 refills | Status: AC

## 2021-08-18 ENCOUNTER — Ambulatory Visit: Payer: 59 | Admitting: Behavioral Health

## 2021-08-18 DIAGNOSIS — F331 Major depressive disorder, recurrent, moderate: Secondary | ICD-10-CM

## 2021-08-18 DIAGNOSIS — F419 Anxiety disorder, unspecified: Secondary | ICD-10-CM

## 2021-08-18 NOTE — Progress Notes (Signed)
Internal Medicine Clinic Attending  Case discussed with Dr. Coe  At the time of the visit.  We reviewed the resident's history and exam and pertinent patient test results.  I agree with the assessment, diagnosis, and plan of care documented in the resident's note.  

## 2021-08-18 NOTE — BH Specialist Note (Signed)
Integrated Behavioral Health via Telemedicine Visit ? ?08/18/2021 ?YOUSSEF FOOTMAN ?[id number] ? ?Number of Integrated Behavioral Health Clinician visits: 2 ?Session Start time: 1330 ?Session End time: 1415 ?Total time in minutes: 45 min ? ?Referring Provider: Dr. Orinda Kenner, MD ?Patient/Family location: Pt is in his car & has privacy ?[facility] Provider location: [facility] Office ?All persons participating in visit: Pt & Clinician ?Types of Service: Individual psychotherapy ? ?I connected with [name] and/or Gerrit Friends Casagrande's  self  via  Telephone or Video Enabled Telemedicine Application  (Video is Caregility application) and verified that I am speaking with the correct person using two identifiers. Discussed confidentiality: Yes  ? ?I discussed the limitations of telemedicine and the availability of in person appointments.  Discussed there is a possibility of technology failure and discussed alternative modes of communication if that failure occurs. ? ?I discussed that engaging in this telemedicine visit, they consent to the provision of behavioral healthcare and the services will be billed under their insurance. ? ?Patient and/or legal guardian expressed understanding and consented to Telemedicine visit: Yes  ? ?Presenting Concerns: ?Patient and/or family reports the following symptoms/concerns: elevated anx/dep due to his Dtr's exp in [place], [place] last Fall on vacation where a young woman was killed. Pt's anx/dep have been bothering him ever since, although the initial rxn to this homicide has slowed down some, & Pt's Dtr is managing things, he is still concerned for her & his surge of Sx.  ? ?Pt was prescribed Lexapro & only took for 2 days, complaining it did not work. Provided psychoedu re: psychopharmacologicals to explain compliance & effectiveness. Pt requests a medication that will not upset his GI track. ? ? ?Duration of problem: several mos of worry for Dtr's situation; Severity of problem: moderate ? ?Patient  and/or Family's Strengths/Protective Factors: ?Concrete supports in place (healthy food, safe environments, etc.), Sense of purpose, and Physical Health (exercise, healthy diet, medication compliance, etc.) ? ?Pt is determined to care for his Px health better so he can keep up w/his 6yo Son & his Dtr who now lives in [place], attends online classes, & works w/her Mother in the mental health field. ? ?Goals Addressed: ?Patient will: ? Reduce symptoms of: agitation, anxiety, and depression Pt is finding it difficult to deal w/his Sx; c/o depression & jitteriness ? Increase knowledge and/or ability of: coping skills, healthy habits, and stress reduction  ? Demonstrate ability to: Increase healthy adjustment to current life circumstances and Improve medication compliance ? ?Progress towards Goals: ?Ongoing ? ?Interventions: ?Interventions utilized:  Solution-Focused Strategies, Mindfulness or Relaxation Training, Medication Monitoring, and Supportive Counseling ?Standardized Assessments completed:  screeners prn ? ?Patient and/or Family Response: Pt receptive to call today & requests future visit ? ?Assessment: ?Patient currently experiencing consistent levels of dep/anx that have not remitted. Pt was non-compliant w/medication administration; given psychoedu & encouraged to secure an appt w/his PCP to revisit psychopharms that will not irritate his GI track. Pt agreed. ? ?Patient may benefit from daily sunlight & lite exercise to improve his BMI & quality of sleep via circadian rhythm functioning. Suggested to Pt he take OTC Vit D 3 to improve his sleep. Directed Pt to bring this up w/PCP as they will have addt'l information for him about his OTC Vit supplement intake. Pt acknowledged.  ? ?Plan: ?Follow up with behavioral health clinician on : 3 wks for 30 min teleheatlh ?Behavioral recommendations: Lite daily exercise per Physician consultation. Daily sunlight to improve sleep quality & OTC  Vit D 3 w/Physician  consult. ?Referral(s): Integrated [employer] (In Clinic) ?Pt will download calm.com & use this app on his phone when Sx of anx/dep become difficult to manage.  ? ?I discussed the assessment and treatment plan with the patient and/or parent/guardian. They were provided an opportunity to ask questions and all were answered. They agreed with the plan and demonstrated an understanding of the instructions. ?  ?They were advised to call back or seek an in-person evaluation if the symptoms worsen or if the condition fails to improve as anticipated. ? ?Deneise Lever, LMFT ?

## 2021-08-20 ENCOUNTER — Other Ambulatory Visit: Payer: Self-pay | Admitting: Student

## 2021-08-20 DIAGNOSIS — J4520 Mild intermittent asthma, uncomplicated: Secondary | ICD-10-CM

## 2021-09-07 ENCOUNTER — Other Ambulatory Visit: Payer: Self-pay | Admitting: Internal Medicine

## 2021-09-07 DIAGNOSIS — F32A Depression, unspecified: Secondary | ICD-10-CM

## 2021-09-16 ENCOUNTER — Ambulatory Visit: Payer: 59 | Admitting: Behavioral Health

## 2021-09-16 ENCOUNTER — Telehealth: Payer: Self-pay | Admitting: Behavioral Health

## 2021-09-16 NOTE — Telephone Encounter (Signed)
Unable to reach Pt today or lv msg due to VM box not set up. ? ?Dr. Theodis Shove ?

## 2021-09-18 ENCOUNTER — Other Ambulatory Visit: Payer: Self-pay | Admitting: Internal Medicine

## 2021-09-18 DIAGNOSIS — J329 Chronic sinusitis, unspecified: Secondary | ICD-10-CM

## 2021-10-13 ENCOUNTER — Other Ambulatory Visit: Payer: Self-pay

## 2021-10-13 ENCOUNTER — Other Ambulatory Visit: Payer: Self-pay | Admitting: Internal Medicine

## 2021-10-13 DIAGNOSIS — E1165 Type 2 diabetes mellitus with hyperglycemia: Secondary | ICD-10-CM

## 2021-10-13 NOTE — Telephone Encounter (Signed)
Semaglutide,0.25 or 0.5MG /DOS, (OZEMPIC, 0.25 OR 0.5 MG/DOSE,) 2 MG/1.5ML SOPN  [facility] :[address], [place], [place] [address]

## 2021-10-27 ENCOUNTER — Encounter: Payer: Self-pay | Admitting: Dietician

## 2021-11-06 ENCOUNTER — Encounter: Payer: Self-pay | Admitting: *Deleted

## 2021-11-16 ENCOUNTER — Other Ambulatory Visit: Payer: Self-pay | Admitting: Internal Medicine

## 2021-11-16 DIAGNOSIS — J4520 Mild intermittent asthma, uncomplicated: Secondary | ICD-10-CM

## 2021-12-09 ENCOUNTER — Other Ambulatory Visit: Payer: Self-pay

## 2021-12-09 ENCOUNTER — Ambulatory Visit (INDEPENDENT_AMBULATORY_CARE_PROVIDER_SITE_OTHER): Payer: 59

## 2021-12-09 VITALS — BP 128/90 | HR 76 | Temp 98.6°F | Ht 65.0 in | Wt 243.7 lb

## 2021-12-09 DIAGNOSIS — S90851D Superficial foreign body, right foot, subsequent encounter: Secondary | ICD-10-CM | POA: Diagnosis not present

## 2021-12-09 DIAGNOSIS — Z794 Long term (current) use of insulin: Secondary | ICD-10-CM

## 2021-12-09 DIAGNOSIS — Q786 Multiple congenital exostoses: Secondary | ICD-10-CM

## 2021-12-09 DIAGNOSIS — E1165 Type 2 diabetes mellitus with hyperglycemia: Secondary | ICD-10-CM | POA: Diagnosis not present

## 2021-12-09 LAB — GLUCOSE, CAPILLARY: Glucose-Capillary: 169 mg/dL — ABNORMAL HIGH (ref 70–99)

## 2021-12-09 LAB — POCT GLYCOSYLATED HEMOGLOBIN (HGB A1C): Hemoglobin A1C: 8 % — AB (ref 4.0–5.6)

## 2021-12-09 MED ORDER — SEMAGLUTIDE(0.25 OR 0.5MG/DOS) 2 MG/3ML ~~LOC~~ SOPN: 0.5000 mg | PEN_INJECTOR | SUBCUTANEOUS | 0 refills | Status: DC

## 2021-12-09 MED ORDER — SEMAGLUTIDE (1 MG/DOSE) 4 MG/3ML ~~LOC~~ SOPN: 1.0000 mg | PEN_INJECTOR | SUBCUTANEOUS | 2 refills | Status: DC

## 2021-12-09 NOTE — Progress Notes (Deleted)
.  KMHTN -amlodipine 5 mg  NAFLD  GERD Protonix 40  T2DM Ozempic 0.5? Repeat A1c  HLD -rosuvastatin 10mg  -LDL 103 06/2020 above goal  Post herpetic neuralgia Gabapentin 300 mg daily

## 2021-12-09 NOTE — Patient Instructions (Addendum)
Thank you, [name] for allowing [place] to provide your care today. Today we discussed :  Splinter- We removed the splinter from your right heel and consented you for this procedure. Please check to make sure this heals given that you have diabetes. You should watch out for worsening pain, redness, drainage, and fever.  Diabetes- We checked your A1c today. You should start taking ozempic 0.5 mg weekly instead of the 0.25 mg dose. You should take this for 4 weeks. If you tolerate it well we can increase to 1 mg weekly.  I have ordered the following labs for you:   Lab Orders         POC Hbg A1C         Follow up: 3 months   We look forward to seeing you next time. Please call our clinic at [phone] if you have any questions or concerns. The best time to call is Monday-Friday from 9am-4pm, but there is someone available 24/7. If after hours or the weekend, call the main hospital number and ask for the Internal Medicine Resident On-Call. If you need medication refills, please notify your pharmacy one week in advance and they will send [place] a request.   Thank you for trusting me with your care. Wishing you the best!   Willette Cluster, MD [facility]

## 2021-12-10 DIAGNOSIS — Q786 Multiple congenital exostoses: Secondary | ICD-10-CM | POA: Insufficient documentation

## 2021-12-10 DIAGNOSIS — S90851D Superficial foreign body, right foot, subsequent encounter: Secondary | ICD-10-CM | POA: Insufficient documentation

## 2021-12-10 NOTE — Progress Notes (Unsigned)
Established Patient Office Visit  Subjective   Patient ID: Joe Pearson, male    DOB: June 16, 1970  Age: 51 y.o. MRN: [id number]  Chief Complaint  Patient presents with   Follow-up    CHECK RIGHT HEEL (seen in Urgent Care) SPLINTER /     Joe Pearson is a 51 y/o male with a pmh below who presents fo rT2DM f/u and splinter removal. See A/P for HPI.    {History (Optional):23778}  Review of Systems  All other systems reviewed and are negative.     Objective:     BP 128/90 (BP Location: Right Arm, Patient Position: Sitting, Cuff Size: Normal)   Pulse 76   Temp 98.6 F (37 C) (Oral)   Ht 5\' 5"  (1.651 m)   Wt 243 lb 11.2 oz (110.5 kg)   SpO2 96%   BMI 40.55 kg/m  {Vitals History (Optional):23777}  Physical Exam   Results for orders placed or performed in visit on 12/09/21  Glucose, capillary  Result Value Ref Range   Glucose-Capillary 169 (H) 70 - 99 mg/dL  POC Hbg 12/11/21  Result Value Ref Range   Hemoglobin A1C 8.0 (A) 4.0 - 5.6 %   HbA1c POC (<> result, manual entry)     HbA1c, POC (prediabetic range)     HbA1c, POC (controlled diabetic range)      {Labs (Optional):23779}  The 10-year ASCVD risk score (Arnett DK, et al., 2019) is: 8.2%    Assessment & Plan:   Problem List Items Addressed This Visit       Endocrine   Type 2 diabetes mellitus (HCC) - Primary (Chronic)    The patient had worsening of A1c to 8.0 from 7.3. At his last visit 08/2021 his metformin was stopped and he was started on ozempic 0.25mg  weekly. He said it gave him some GI issues so he stopped it. His sister is on a GLP1 injectible and advised him to resume, so he has now been on the 0.25 dose for 3 weeks. His A1c has expectedly increased as he is no longer on metformin and his ozempic was not at a therapeutic dose. -Start ozempic 0.5mg  weekly, titrate as needed -3 month f/u with A1c check      Relevant Medications   Semaglutide,0.25 or 0.5MG /DOS, 2 MG/3ML SOPN   Semaglutide, 1  MG/DOSE, 4 MG/3ML SOPN (Start on 01/08/2022)   Other Relevant Orders   POC Hbg A1C (Completed)     Musculoskeletal and Integument   Multiple osteochondromas of long bone    Patient endorses multiple benign bone lesions that has had surgery for in the past. He says his sister has them as well, which would likely make this syndromic. The patient called his sister to verify the name and she says osteochondromas. The patient is currently having pain from bony outgrowths at the bilateral ankle joints and wrist. He follows with podiatry and in clinic xrays from them were dictated as normal. Upon review the right lateral radiograph shows abnormal bony contour of the distal tibia and fibula with what appears to be cystic bone changes. The patient wishes to get pain control for this but I do not think narcotics are appropriate. He likely needs to see ortho for dedicated imaging and more appropriate treatment options.        Other   Splinter of foot without infection, right, subsequent encounter    Splinter removal today.       No follow-ups on file.    01/10/2022  Aundria Rud, MD

## 2021-12-10 NOTE — Assessment & Plan Note (Signed)
The patient was walking barefoot in his house and stepped on a splinter emerging from a wooden floor board.  He recently went to urgent care for removal and said that they did not completely remove the splinter. He is having persistent pain but has not had fever, chills, and exam was negative for erythema, warmth, drainage or ptp. Splinter removal today performed after consent for persistent foreign body noted at the right plantar surface of the posterior heel.

## 2021-12-10 NOTE — Assessment & Plan Note (Signed)
Patient endorses multiple benign bone lesions that has had surgery for in the past. He says his sister has them as well, which would likely make this syndromic. The patient called his sister to verify the name and she says osteochondromas. The patient is currently having pain from bony outgrowths at the bilateral ankle joints and wrist. He follows with podiatry and in clinic xrays from them were dictated as normal. Upon review the right lateral radiograph shows abnormal bony contour of the distal tibia and fibula with what appears to be cystic bone changes. The patient wishes to get pain control for this but I do not think narcotics are appropriate. He likely needs to see ortho for dedicated imaging and more appropriate treatment options. -At next visit consider ankle radiographs and ortho referal

## 2021-12-10 NOTE — Assessment & Plan Note (Signed)
The patient had worsening of A1c to 8.0 from 7.3. At his last visit 08/2021 his metformin was stopped and he was started on ozempic 0.25mg  weekly. He said it gave him some GI issues so he stopped it. His sister is on a GLP1 injectible and advised him to resume, so he has now been on the 0.25 dose for 3 weeks. His A1c has expectedly increased as he is no longer on metformin and his ozempic was not at a therapeutic dose. -Start ozempic 0.5mg  weekly, titrate as needed -3 month f/u with A1c check

## 2021-12-13 NOTE — Progress Notes (Signed)
Internal Medicine Clinic Attending  Case discussed with Dr. Aundria Rud  at the time of the visit.  We reviewed the resident's history and exam and pertinent patient test results.  I agree with the assessment, diagnosis, and plan of care documented in the resident's note.   After preparation with alcohol pad, an 18 gauge needle was used to extract a small oblong portion of a splinter from the right medial/posterior heal.  Cleaning occurred afterwards and patient was recommended further foot care.  Consent was obtained.

## 2022-01-04 LAB — BASIC METABOLIC PANEL: Creatinine: 1.3 (ref 0.6–1.3)

## 2022-01-04 LAB — COMPREHENSIVE METABOLIC PANEL
Creat: 1.3
eGFR: 66

## 2022-01-05 ENCOUNTER — Telehealth: Payer: Self-pay | Admitting: *Deleted

## 2022-01-05 NOTE — Telephone Encounter (Signed)
Called patient and addressed his concerns. I advised him to continue both the metformin and Ozempic and also check his BS a few times daily. He is scheduled to follow up with [place] next week.

## 2022-01-05 NOTE — Telephone Encounter (Addendum)
Call from pt who stated he was not feeling well last night so he went to the ER. He went to [facility] ER. His BS was 667 and before he left it was down to 360's. Stated they gave him a rx for Metformin, unsure of dosage. They told him it's ok to take Metformin along with Ozempic 0.5 mg in which he takes on Wed weekly. Stated his BS was 312 ac meal this am then 344 after he ate breakfast. He took metformin this am. He has not taken ozempic yet today - he wants to be sure it's ok? I asked pt to check his BS-stated he's in his truck but will try to re-check his BS and call back before 1630PM. [facility] Everywhere)

## 2022-01-13 ENCOUNTER — Encounter: Payer: Self-pay | Admitting: Student

## 2022-01-13 ENCOUNTER — Ambulatory Visit (INDEPENDENT_AMBULATORY_CARE_PROVIDER_SITE_OTHER): Payer: 59 | Admitting: Student

## 2022-01-13 ENCOUNTER — Other Ambulatory Visit: Payer: Self-pay

## 2022-01-13 VITALS — BP 129/88 | HR 71 | Temp 97.8°F | Ht 66.0 in | Wt 232.4 lb

## 2022-01-13 DIAGNOSIS — E7841 Elevated Lipoprotein(a): Secondary | ICD-10-CM

## 2022-01-13 DIAGNOSIS — Z7984 Long term (current) use of oral hypoglycemic drugs: Secondary | ICD-10-CM

## 2022-01-13 DIAGNOSIS — Z7985 Long-term (current) use of injectable non-insulin antidiabetic drugs: Secondary | ICD-10-CM

## 2022-01-13 DIAGNOSIS — E1169 Type 2 diabetes mellitus with other specified complication: Secondary | ICD-10-CM

## 2022-01-13 DIAGNOSIS — E785 Hyperlipidemia, unspecified: Secondary | ICD-10-CM

## 2022-01-13 DIAGNOSIS — E1165 Type 2 diabetes mellitus with hyperglycemia: Secondary | ICD-10-CM | POA: Diagnosis not present

## 2022-01-13 DIAGNOSIS — I10 Essential (primary) hypertension: Secondary | ICD-10-CM

## 2022-01-13 LAB — POCT GLYCOSYLATED HEMOGLOBIN (HGB A1C): Hemoglobin A1C: 10.7 % — AB (ref 4.0–5.6)

## 2022-01-13 LAB — GLUCOSE, CAPILLARY: Glucose-Capillary: 250 mg/dL — ABNORMAL HIGH (ref 70–99)

## 2022-01-13 MED ORDER — METFORMIN HCL ER 750 MG PO TB24: 750.0000 mg | ORAL_TABLET | Freq: Every day | ORAL | 0 refills | Status: DC

## 2022-01-13 MED ORDER — ROSUVASTATIN CALCIUM 10 MG PO TABS: 10.0000 mg | ORAL_TABLET | Freq: Every day | ORAL | 11 refills | Status: AC

## 2022-01-13 NOTE — Assessment & Plan Note (Signed)
Has not been taking rosuvastatin for some time.  Now the patient is motivated to get his health back on track, I recommend restarting moderate intensity statin therapy for primary prevention of ASCVD. - Start rosuvastatin 10 mg daily. - Repeat lipid panel at next visit.

## 2022-01-13 NOTE — Patient Instructions (Signed)
Thank you, [name] for allowing [place] to provide your care today. Today we discussed diabetes and back pain.  For your diabetes continue taking your Ozempic weekly.  I will also refill your metformin, to be taken daily.  Try your best to remember to check your blood sugar every morning before your first meal of the day.  When you come back to the clinic in a month we will use these measurements to determine whether or not the medications are working.  For your cholesterol I would like you to restart your Crestor.  You can take this every day with your metformin.  This will help reduce your risk of developing heart attack or stroke in the future.  I have ordered the following labs for you:  Lab Orders         Glucose, capillary         POC Hbg A1C      Referrals ordered today:   Referral Orders  No referral(s) requested today     I have ordered the following medication/changed the following medications:   Start the following medications: Meds ordered this encounter  Medications   rosuvastatin (CRESTOR) 10 MG tablet    Sig: Take 1 tablet (10 mg total) by mouth daily.    Dispense:  30 tablet    Refill:  11   metFORMIN (GLUCOPHAGE-XR) 750 MG 24 hr tablet    Sig: Take 1 tablet (750 mg total) by mouth daily with breakfast.    Dispense:  90 tablet    Refill:  0     Follow up: 1 months   Remember: Check your blood sugar in the mornings before your first meal.  We look forward to seeing you next time. Please call our clinic at [phone] if you have any questions or concerns. The best time to call is Monday-Friday from 9am-4pm, but there is someone available 24/7. If after hours or the weekend, call the main hospital number and ask for the Internal Medicine Resident On-Call. If you need medication refills, please notify your pharmacy one week in advance and they will send [place] a request.   Thank you for trusting me with your care. Wishing you the best!   Marrianne Mood,  MD [facility]

## 2022-01-13 NOTE — Assessment & Plan Note (Signed)
Primary reason for visit.  Patient was not feeling well couple of weeks ago.  Had a couple of measurements on his home glucose monitor that read "high".  Acknowledged that he had stopped taking his Ozempic.  Was not adherent to healthy diet.  Went to urgent care for an evaluation where they recommended go to the emergency department for hyperglycemia.  At that time he was not in DKA.  He received a dose of insulin as prescribed with a short course of metformin on discharge, and instructions to follow-up with his PCP.  Hemoglobin A1c on that date was 10, up from 8 a month prior.  Capillary blood glucose today is 250.  This patient has had an excellent response to medical therapy in the past, achieving a hemoglobin A1c of 5.6 (down from 10) on a regimen of metformin and lifestyle modification.  Today he presents to clinic motivated to get back on track.  I will recommend resuming once weekly Ozempic, paired with once daily metformin XR 750 mg.  He is to follow-up in a month with fasting CBG measurements, at which point we will evaluate his response to therapy. - Ozempic 0.5 mg weekly - Metformin XR 750 mg daily - Follow-up fasting CBG results at next appointment.

## 2022-01-13 NOTE — Progress Notes (Signed)
Subjective:  CC: Follow-up after ED visit for hyperglycemia.  HPI:  [name] is a 51 y.o. male with a past medical history stated below and presents today for follow-up after ED visit for hyperglycemia. Please see problem based assessment and plan for additional details.  Past Medical History:  Diagnosis Date   COVID-19 virus vaccine not available 02/05/2020   Diabetes mellitus without complication (HCC)    Hyperlipidemia    Hypertension    NAFLD (nonalcoholic fatty liver disease)     Current Outpatient Medications on File Prior to Visit  Medication Sig Dispense Refill   hydrOXYzine (ATARAX) 10 MG tablet TAKE THIS AT NIGHT BEFORE BED IF HAVING TROUBLE SLEEPING 90 tablet 1   albuterol (VENTOLIN HFA) 108 (90 Base) MCG/ACT inhaler INHALE 1-2 PUFFS INTO THE LUNGS EVERY 4 HOURS AS NEEDED FOR WHEEZING OR SHORTNESS OF BREATH. 8.5 each 1   fluticasone (FLONASE) 50 MCG/ACT nasal spray SPRAY 1 SPRAY INTO BOTH NOSTRILS DAILY. 16 mL 2   ketotifen (ALAWAY) 0.025 % ophthalmic solution Place 1 drop into the left eye 2 (two) times daily. 5 mL 0   Semaglutide,0.25 or 0.5MG /DOS, 2 MG/3ML SOPN Inject 0.5 mg into the skin once a week. 3 mL 0   No current facility-administered medications on file prior to visit.    Family History  Problem Relation Age of Onset   Cancer Mother        breast   Sudden death Neg Hx    Hyperlipidemia Neg Hx    Hypertension Neg Hx    Heart attack Neg Hx    Diabetes Neg Hx     Social History   Socioeconomic History   Marital status: Single    Spouse name: Not on file   Number of children: Not on file   Years of education: Not on file   Highest education level: Not on file  Occupational History   Not on file  Tobacco Use   Smoking status: Never   Smokeless tobacco: Never  Substance and Sexual Activity   Alcohol use: No    Alcohol/week: 0.0 standard drinks of alcohol   Drug use: No   Sexual activity: Not on file  Other Topics Concern    Not on file  Social History Narrative   Not on file   Social Determinants of Health   Financial Resource Strain: Not on file  Food Insecurity: Not on file  Transportation Needs: Not on file  Physical Activity: Not on file  Stress: Not on file  Social Connections: Not on file  Intimate Partner Violence: Not on file    Review of Systems: ROS negative except for what is noted on the assessment and plan.  Objective:   Vitals:   01/13/22 0850 01/13/22 0855  BP: 135/85 129/88  Pulse: 72 71  Temp: 97.8 F (36.6 C)   TempSrc: Oral   SpO2: 100%   Weight: 232 lb 6.4 oz (105.4 kg)   Height: 5\' 6"  (1.676 m)     Physical Exam: Constitutional: Male sitting upright in chair in exam room. Cardiovascular: Regular rate and rhythm.  Bilateral radial pulses 2+. Pulmonary/Chest: Normal work of breathing.  Lungs clear to auscultation. Abdominal: Soft.  Nontender.  Nondistended.  Centripetal obesity. Neurological: Alert and oriented. Skin: Warm and dry. Psych: Somewhat tired appearing.  Appropriate mood and affect.   Assessment & Plan:  Hypertension Remains off blood pressure medicine.  BP today is 129/88.  We will hold off reinitiating therapy  at this time.  Follow-up with patient in a month and recheck then.  Type 2 diabetes mellitus (HCC) Primary reason for visit.  Patient was not feeling well couple of weeks ago.  Had a couple of measurements on his home glucose monitor that read "high".  Acknowledged that he had stopped taking his Ozempic.  Was not adherent to healthy diet.  Went to urgent care for an evaluation where they recommended go to the emergency department for hyperglycemia.  At that time he was not in DKA.  He received a dose of insulin as prescribed with a short course of metformin on discharge, and instructions to follow-up with his PCP.  Hemoglobin A1c on that date was 10, up from 8 a month prior.  Capillary blood glucose today is 250.  This patient has had an excellent  response to medical therapy in the past, achieving a hemoglobin A1c of 5.6 (down from 10) on a regimen of metformin and lifestyle modification.  Today he presents to clinic motivated to get back on track.  I will recommend resuming once weekly Ozempic, paired with once daily metformin XR 750 mg.  He is to follow-up in a month with fasting CBG measurements, at which point we will evaluate his response to therapy. - Ozempic 0.5 mg weekly - Metformin XR 750 mg daily - Follow-up fasting CBG results at next appointment.  Hyperlipidemia Has not been taking rosuvastatin for some time.  Now the patient is motivated to get his health back on track, I recommend restarting moderate intensity statin therapy for primary prevention of ASCVD. - Start rosuvastatin 10 mg daily. - Repeat lipid panel at next visit.    Patient seen with Dr. Lawernce Keas, M.D. [facility] Internal [facility]  PGY-1 Pager: [phone] Date 01/13/2022  Time 10:03 AM

## 2022-01-13 NOTE — Addendum Note (Signed)
Addended by: Marrianne Mood on: 01/13/2022 10:09 AM   Modules accepted: Orders

## 2022-01-13 NOTE — Assessment & Plan Note (Signed)
Remains off blood pressure medicine.  BP today is 129/88.  We will hold off reinitiating therapy at this time.  Follow-up with patient in a month and recheck then.

## 2022-01-14 NOTE — Progress Notes (Signed)
Internal Medicine Clinic Attending  I saw and evaluated the patient.  I personally confirmed the key portions of the history and exam documented by Dr. McLendon and I reviewed pertinent patient test results.  The assessment, diagnosis, and plan were formulated together and I agree with the documentation in the resident's note.  

## 2022-01-24 ENCOUNTER — Telehealth: Payer: Self-pay | Admitting: *Deleted

## 2022-01-24 DIAGNOSIS — H538 Other visual disturbances: Secondary | ICD-10-CM

## 2022-01-24 NOTE — Telephone Encounter (Signed)
Call from pt stating his sight is "real blurry" and he needs to see an eye doctor. An ophthalmology referral was ordered 08/2021 - stated he did not go b/c he was sick.  I asked pt did he call Dr Laruth Bouchard office to re-schedule - stated he did and they do not have any appts until Jan 2024. He's requesting a new referral to another office. Thanks

## 2022-02-14 ENCOUNTER — Ambulatory Visit (INDEPENDENT_AMBULATORY_CARE_PROVIDER_SITE_OTHER): Payer: Commercial Managed Care - HMO | Admitting: Student

## 2022-02-14 ENCOUNTER — Other Ambulatory Visit: Payer: Self-pay | Admitting: Student

## 2022-02-14 VITALS — BP 116/68 | HR 74 | Temp 98.6°F | Wt 228.8 lb

## 2022-02-14 DIAGNOSIS — Z7985 Long-term (current) use of injectable non-insulin antidiabetic drugs: Secondary | ICD-10-CM

## 2022-02-14 DIAGNOSIS — I10 Essential (primary) hypertension: Secondary | ICD-10-CM

## 2022-02-14 DIAGNOSIS — K219 Gastro-esophageal reflux disease without esophagitis: Secondary | ICD-10-CM | POA: Diagnosis not present

## 2022-02-14 DIAGNOSIS — H538 Other visual disturbances: Secondary | ICD-10-CM

## 2022-02-14 DIAGNOSIS — R1310 Dysphagia, unspecified: Secondary | ICD-10-CM | POA: Insufficient documentation

## 2022-02-14 DIAGNOSIS — Z0000 Encounter for general adult medical examination without abnormal findings: Secondary | ICD-10-CM

## 2022-02-14 DIAGNOSIS — E7841 Elevated Lipoprotein(a): Secondary | ICD-10-CM

## 2022-02-14 DIAGNOSIS — E1165 Type 2 diabetes mellitus with hyperglycemia: Secondary | ICD-10-CM | POA: Diagnosis not present

## 2022-02-14 DIAGNOSIS — R1319 Other dysphagia: Secondary | ICD-10-CM

## 2022-02-14 DIAGNOSIS — Z7984 Long term (current) use of oral hypoglycemic drugs: Secondary | ICD-10-CM

## 2022-02-14 LAB — GLUCOSE, CAPILLARY: Glucose-Capillary: 168 mg/dL — ABNORMAL HIGH (ref 70–99)

## 2022-02-14 MED ORDER — METFORMIN HCL ER 750 MG PO TB24: 750.0000 mg | ORAL_TABLET | Freq: Every day | ORAL | 0 refills | Status: AC

## 2022-02-14 MED ORDER — SEMAGLUTIDE(0.25 OR 0.5MG/DOS) 2 MG/3ML ~~LOC~~ SOPN: 0.5000 mg | PEN_INJECTOR | SUBCUTANEOUS | 0 refills | Status: DC

## 2022-02-14 MED ORDER — PANTOPRAZOLE SODIUM 40 MG PO TBEC: 40.0000 mg | DELAYED_RELEASE_TABLET | Freq: Every day | ORAL | 2 refills | Status: AC

## 2022-02-14 NOTE — Assessment & Plan Note (Signed)
Stable off blood pressure medication and blood pressure today was 116/68.  Continue to check blood pressure upon follow-up visits.

## 2022-02-14 NOTE — Assessment & Plan Note (Signed)
Patient notes recent blurry vision in his right eye that is intermittent and has an appointment with an ophthalmologist on 02/18/2022.

## 2022-02-14 NOTE — Patient Instructions (Signed)
  Thank you, Mr.Chrstopher NEALE MARZETTE, for allowing [place] to provide your care today. Today we discussed . . .  > Diabetes       - Your blood sugars at home are well controlled on your current ozempic and metformin. We will continue these medications and send refills. Please continue to monitor your blood sugar and keep a log for your next visit. Take some blood sugar readings before breakfast for fasting levels.  > Dysphagia       - We discussed the sensation you are having in your throat. We think this is most likely due to a stricture of your esophagus or a diverticulum of your esophagus. We will send a referral to GI and order a barium esophagram today. > Hyperlipidemia       - We will check a lipid panel today and plan to start rosuvastatin if still indicated.    I have ordered the following labs for you:   Lab Orders         Glucose, capillary         Lipid Profile       Tests ordered today:  Barium Esophagram    Referrals ordered today:   Referral Orders  No referral(s) requested today   We will send a referral for a Gastroenterologist to further evaluate the sensation in your throat.    I have ordered the following medication/changed the following medications:   Stop the following medications: There are no discontinued medications.   Start the following medications: No orders of the defined types were placed in this encounter.     Follow up: 2 months    Remember:  Keep a log of your blood sugars and call the clinic if you have any low readings.    Should you have any questions or concerns please call the internal medicine clinic at [phone].     Johny Blamer, [place]

## 2022-02-14 NOTE — Assessment & Plan Note (Addendum)
Patient was started on Ozempic 0.5 mg weekly and metformin XR 750 mg daily at last visit 1 month ago.  Since then he has been monitoring his blood sugar but unfortunately not writing it down.  His sugars have run between 80 and 100 and he usually checks them before bed or before meals.  Blood sugar today in the office was 168 which she notes he did eat recently.  He has had some mild constipation which has resolved with MiraLAX, but otherwise denies any other adverse effects from his medications.  -Continue current management with Ozempic 0.5 mg weekly and metformin XR 750 mg daily. - Return in 2 months to repeat A1c

## 2022-02-14 NOTE — Assessment & Plan Note (Signed)
Patient was prescribed rosuvastatin 10 mg daily at last visit with plan to repeat lipid panel at this visit.  He did not pick up this medication as it was sent from pharmacy.  We will repeat the lipid panel today and plan to start him on rosuvastatin 10 mg daily unless the lipid panel is normal.

## 2022-02-14 NOTE — Assessment & Plan Note (Signed)
Patient denied flu shot today.

## 2022-02-14 NOTE — Assessment & Plan Note (Signed)
Discussed persistent dysphagia with patient and the most likely causes being esophageal stricture or diverticulum.  Discussed GI referral and barium esophagram which patient is agreeable to.  - Sent for barium esophagram and send GI referral

## 2022-02-14 NOTE — Assessment & Plan Note (Signed)
Patient has a history of GERD and has had some return of his symptoms while he has been off Protonix.  He has been on Protonix 40 mg daily in the past.  We will restart this.

## 2022-02-14 NOTE — Progress Notes (Signed)
   CC: Follow up Diabetes   HPI:  [name] is a 51 y.o. male with past medical history as below who presents for follow-up visit after restarting Ozempic and metformin at his last visit on 01/13/2022.  Past Medical History:  Diagnosis Date   COVID-19 virus vaccine not available 02/05/2020   Diabetes mellitus without complication ([place])    Hyperlipidemia    Hypertension    NAFLD (nonalcoholic fatty liver disease)    Review of Systems:   Dysphagia. No chest pain, dyspnea, nausea, vomiting, abdominal pain, fever, chills, presyncope, fatigue.  Physical Exam:  Vitals:   02/14/22 1453  BP: 116/68  Pulse: 74  Temp: 98.6 F (37 C)  TempSrc: Oral  SpO2: 96%  Weight: 228 lb 12.8 oz (103.8 kg)   Constitutional: Overweight male sitting in chair. In no acute distress. HENT: Normocephalic Eyes: PERRL Cardio:Regular rate and rhythm. No murmurs, rubs, or gallops. Pulm:Clear to auscultation bilaterally. Normal work of breathing on room air. Abdomen: Soft, nontender, positive bowel sounds MSK: Mild lower extremity edema at the ankles. Skin:Warm and dry. Neuro:Alert and oriented x3. No focal deficit noted. Psych:Pleasant mood and affect.   Assessment & Plan:   See Encounters Tab for problem based charting.  Patient seen with Dr. Jimmye Norman

## 2022-02-16 LAB — LIPID PANEL
Chol/HDL Ratio: 3 ratio (ref 0.0–5.0)
Cholesterol, Total: 115 mg/dL (ref 100–199)
HDL: 38 mg/dL — ABNORMAL LOW (ref >39)
LDL Chol Calc (NIH): 60 mg/dL (ref 0–99)
Triglycerides: 84 mg/dL (ref 0–149)
VLDL Cholesterol Cal: 17 mg/dL (ref 5–40)

## 2022-02-16 NOTE — Progress Notes (Signed)
Called patient and discussed results with him.  ASCVD risk algorithm calculates 4.8% 10-year risk, however with his LDL less than 70 and his noted aversion to taking the prior statin we will monitor this and continue with lifestyle modification including getting his diabetes well controlled.  We will continue to monitor this.  All questions were answered.

## 2022-02-17 LAB — HM DIABETES EYE EXAM

## 2022-02-18 ENCOUNTER — Telehealth: Payer: Self-pay | Admitting: *Deleted

## 2022-02-18 MED ORDER — TRULICITY 0.75 MG/0.5ML ~~LOC~~ SOAJ: SUBCUTANEOUS | 1 refills | Status: DC

## 2022-02-18 NOTE — Telephone Encounter (Signed)
Pt was notified .Marland Kitchen He stated hat he does not have the card but the insurance company sent him a link to download a copy he will do it and bring a copy in and ask to give it to [place] ( [place] )  on Monday 10/9

## 2022-02-18 NOTE — Telephone Encounter (Signed)
We do not have an actual insurance card on file so the PA  can't be done  because there is numbers needed  for PA I will contact pt to bring in his card

## 2022-02-18 NOTE — Telephone Encounter (Signed)
Patient called in stating his Pharmacy told him they are waiting on PA for Ozempic. Will forward to PA staff.

## 2022-02-21 NOTE — Progress Notes (Signed)
Internal Medicine Clinic Attending  I saw and evaluated the patient.  I personally confirmed the key portions of the history and exam documented by Dr. Goodwin and I reviewed pertinent patient test results.  The assessment, diagnosis, and plan were formulated together and I agree with the documentation in the resident's note.  

## 2022-03-01 ENCOUNTER — Encounter: Payer: Self-pay | Admitting: Internal Medicine

## 2022-03-02 NOTE — Telephone Encounter (Signed)
Insurance for future PA if needed     [employer]      MEM ID # [id number]  RX BIN : [id number]  RX PCN : [id number]        ( COPY PLACED TO BE SCANNED TO CHART  ALSO )

## 2022-03-02 NOTE — Telephone Encounter (Signed)
Pt has brought the card in but the [facility] is no longer on his med list

## 2022-03-02 NOTE — Telephone Encounter (Signed)
Patient calling back for Ozempic. States he was doing well on this, he just needed a PA which wasn't able to be done till he brought in insurance card.  He does not understand why this is no longer on med list and now has Rx for Trulicity. Please advise.

## 2022-03-02 NOTE — Telephone Encounter (Signed)
Patient notified of info below from [place]. He is very Patent attorney. He will discuss med with Pharmacist to be sure he knows how to take.

## 2022-03-22 ENCOUNTER — Encounter: Payer: Self-pay | Admitting: Dietician

## 2022-04-01 ENCOUNTER — Encounter: Payer: Self-pay | Admitting: Gastroenterology

## 2022-04-01 ENCOUNTER — Ambulatory Visit (INDEPENDENT_AMBULATORY_CARE_PROVIDER_SITE_OTHER): Payer: Commercial Managed Care - HMO | Admitting: Gastroenterology

## 2022-04-01 VITALS — BP 102/76 | HR 71 | Ht 66.0 in | Wt 230.0 lb

## 2022-04-01 DIAGNOSIS — Z1212 Encounter for screening for malignant neoplasm of rectum: Secondary | ICD-10-CM

## 2022-04-01 DIAGNOSIS — Z1211 Encounter for screening for malignant neoplasm of colon: Secondary | ICD-10-CM

## 2022-04-01 DIAGNOSIS — K219 Gastro-esophageal reflux disease without esophagitis: Secondary | ICD-10-CM | POA: Diagnosis not present

## 2022-04-01 MED ORDER — NA SULFATE-K SULFATE-MG SULF 17.5-3.13-1.6 GM/177ML PO SOLN: 1.0000 | Freq: Once | ORAL | 0 refills | Status: AC

## 2022-04-01 NOTE — Patient Instructions (Addendum)
_______________________________________________________  If you are age 53 or older, your body mass index should be between 23-30. Your Body mass index is 37.12 kg/m. If this is out of the aforementioned range listed, please consider follow up with your Primary Care Provider.  If you are age 55 or younger, your body mass index should be between 19-25. Your Body mass index is 37.12 kg/m. If this is out of the aformentioned range listed, please consider follow up with your Primary Care Provider.   You have been scheduled for an endoscopy and colonoscopy. Please follow the written instructions given to you at your visit today. Please pick up your prep supplies at the pharmacy within the next 1-3 days. If you use inhalers (even only as needed), please bring them with you on the day of your procedure.   Take Protonix daily 30-45 minutes before breakfast.  Start Metamucil daily with breakfast.   The [facility] providers would like to encourage you to use [facility] to communicate with providers for non-urgent requests or questions.  Due to long hold times on the telephone, sending your provider a message by [facility] may be a faster and more efficient way to get a response.  Please allow 48 business hours for a response.  Please remember that this is for non-urgent requests.   It was a pleasure to see you today!  Thank you for trusting me with your gastrointestinal care!    Scott E.Tomasa Rand, MD

## 2022-04-01 NOTE — Progress Notes (Signed)
HPI : Joe Pearson is a very pleasant 51 year old male with a history of diabetes, hyperlipidemia and hypertension who is referred to [place] by Dr. Willette Cluster for further evaluation of suspected GERD symptoms.  Patient states that he started having the symptoms about a year ago, and they have been persistent but not progressive since then.  He reports having very frequent throat irritation on a daily basis.  Feels like he has to clear his throat all the time.  He also has episodic acid regurgitation, particularly at night.  He has been woken up many times by acid in his throat and mouth with severe burning.  He rarely has heartburn during the day, but does sometimes feel a fullness in his chest.  Occasionally he will regurgitate ingested food.  He denies any problems with food feeling stuck when he swallows.  No need to wash food down, and no episodes of having to bring up swallowed food.  No problems with abdominal pain. He is prescribed Protonix, but admits that he has not been taking it on a daily basis.  He tends to take it when he is having more bothersome symptoms. He lost weight earlier this year from improving his diet, but has regained some of it recently which she attributes to a change in his medications. He reports that he used to always eat right before going to bed, but he has started eating earlier in the evening, and this is helped his nighttime reflux symptoms.  The patient has never had an upper or lower endoscopy.  He reports that he submitted a stool sample for colon cancer screening several months ago.  I am able to see an order for a fecal occult blood test in March of this year, but there is no result attached. The patient denies any family history of colon cancer.  He has regular bowel movements, but does struggle with constipation and hard stools sometimes.  He takes MiraLAX as needed.   He has no known cardiopulmonary comorbidities and denies any concerning symptoms such as  exertional chest pressure, lightheadedness/dizziness, shortness of breath, orthopnea or lower extremity swelling.  Past Medical History:  Diagnosis Date   COVID-19 virus vaccine not available 02/05/2020   Diabetes mellitus without complication ([facility])    Hyperlipidemia    Hypertension    NAFLD (nonalcoholic fatty liver disease)      Past Surgical History:  Procedure Laterality Date   APPENDECTOMY     TUMOR REMOVAL Left    left leg tumor removal   Family History  Problem Relation Age of Onset   Cancer Mother        breast   Sudden death Neg Hx    Hyperlipidemia Neg Hx    Hypertension Neg Hx    Heart attack Neg Hx    Diabetes Neg Hx    Social History   Tobacco Use   Smoking status: Never   Smokeless tobacco: Never  Vaping Use   Vaping Use: Never used  Substance Use Topics   Alcohol use: No    Alcohol/week: 0.0 standard drinks of alcohol   Drug use: No   Current Outpatient Medications  Medication Sig Dispense Refill   albuterol (VENTOLIN HFA) 108 (90 Base) MCG/ACT inhaler INHALE 1-2 PUFFS INTO THE LUNGS EVERY 4 HOURS AS NEEDED FOR WHEEZING OR SHORTNESS OF BREATH. 8.5 each 1   Dulaglutide (TRULICITY) 0.75 MG/0.5ML SOPN Inject 0.75 mg in the skin once weekly for 4 weeks, then inject 1.5 mg in the  skin once weekly for 4 weeks. 3 mL 1   hydrOXYzine (ATARAX) 10 MG tablet TAKE THIS AT NIGHT BEFORE BED IF HAVING TROUBLE SLEEPING (Patient not taking: Reported on 04/01/2022) 90 tablet 1   pantoprazole (PROTONIX) 40 MG tablet Take 1 tablet (40 mg total) by mouth daily. 30 tablet 2   fluticasone (FLONASE) 50 MCG/ACT nasal spray SPRAY 1 SPRAY INTO BOTH NOSTRILS DAILY. (Patient not taking: Reported on 04/01/2022) 16 mL 2   ketotifen (ALAWAY) 0.025 % ophthalmic solution Place 1 drop into the left eye 2 (two) times daily. (Patient not taking: Reported on 04/01/2022) 5 mL 0   metFORMIN (GLUCOPHAGE-XR) 750 MG 24 hr tablet Take 1 tablet (750 mg total) by mouth daily with breakfast. (Patient  not taking: Reported on 04/01/2022) 90 tablet 0   rosuvastatin (CRESTOR) 10 MG tablet Take 1 tablet (10 mg total) by mouth daily. (Patient not taking: Reported on 04/01/2022) 30 tablet 11   No current facility-administered medications for this visit.   Allergies  Allergen Reactions   Contrast Media [Iodinated Contrast Media] Hives and Itching     Review of Systems: All systems reviewed and negative except where noted in HPI.    No results found.  Physical Exam: BP 102/76   Pulse 71   Ht 5\' 6"  (1.676 m)   Wt 230 lb (104.3 kg)   BMI 37.12 kg/m  Constitutional: Pleasant,well-developed, Caucasian male in no acute distress. HEENT: Normocephalic and atraumatic. Conjunctivae are normal. No scleral icterus. Neck supple.  Cardiovascular: Normal rate, regular rhythm.  Pulmonary/chest: Effort normal and breath sounds normal. No wheezing, rales or rhonchi. Abdominal: Soft, obese, nondistended, nontender. Bowel sounds active throughout. There are no masses palpable. No hepatomegaly. Extremities: no edema Neurological: Alert and oriented to person place and time. Skin: Skin is warm and dry. No rashes noted. Psychiatric: Normal mood and affect. Behavior is normal.  CBC    Component Value Date/Time   WBC 7.0 12/29/2017 1851   RBC 4.88 12/29/2017 1851   HGB 13.8 12/29/2017 1851   HGB 13.7 06/18/2015 1508   HCT 43.3 12/29/2017 1851   HCT 41.8 06/18/2015 1508   PLT 193 12/29/2017 1851   PLT 218 06/18/2015 1508   MCV 88.7 12/29/2017 1851   MCV 86 06/18/2015 1508   MCH 28.3 12/29/2017 1851   MCHC 31.9 12/29/2017 1851   RDW 14.0 12/29/2017 1851   RDW 14.0 06/18/2015 1508   LYMPHSABS 2.3 06/18/2015 1508   MONOABS 0.5 09/17/2014 1710   EOSABS 0.3 06/18/2015 1508   BASOSABS 0.0 06/18/2015 1508    CMP     Component Value Date/Time   NA 143 04/15/2021 1620   K 4.8 04/15/2021 1620   CL 103 04/15/2021 1620   CO2 25 04/15/2021 1620   GLUCOSE 130 (H) 04/15/2021 1620   GLUCOSE 105  (H) 12/29/2017 1851   BUN 22 04/15/2021 1620   CREATININE 1.27 04/15/2021 1620   CALCIUM 9.6 04/15/2021 1620   PROT 7.0 04/15/2021 1620   ALBUMIN 4.3 04/15/2021 1620   AST 18 04/15/2021 1620   ALT 27 04/15/2021 1620   ALKPHOS 71 04/15/2021 1620   BILITOT 0.5 04/15/2021 1620   GFRNONAA 73 07/02/2020 1532   GFRAA 85 07/02/2020 1532     ASSESSMENT AND PLAN: 51 year old male with 1 year history of typical and atypical GERD symptoms.  Symptoms have improved somewhat with some behavioral modifications (eating earlier in the evening before bedtime and weight loss).  He is prescribed Protonix, but has not  been taking it as directed.  I recommended that he start taking the Protonix every single day, 30 to 45 minutes before breakfast.  If he is still having frequent symptoms with once daily Protonix, he can increase this to twice a day.  Given the new onset of symptoms at his age, I do recommend an upper endoscopy to exclude leniency and assess for complications of GERD. We discussed the pathophysiology of GERD and the principles of GERD management to include lifestyle modifications  such as dietary discretion (avoidance of alcohol, tobacco, caffeinated and carbonated beverages, spicy/greasy foods, citrus, peppermint/chocolate), weight loss if applicable, head of bed elevation and consuming last meal of day within 3 hours of bedtime; pharmacologic options to include PPIs, H2RAs and OTC antacids; and finally surgical or endoscopic fundoplication.  A handout was provided reinforcing this information. The patient states that he submitted a stool test for colon cancer screening, but does not appear to have been processed.  I suggested he consider a colonoscopy for colon cancer screening, and after discussing the details, risks and benefits, he agreed to proceed with colonoscopy.   GERD -EGD (new onset symptoms) -Protonix daily -GERD handout  CRC screening -Colonoscopy  The details, risks (including  bleeding, perforation, infection, missed lesions, medication reactions and possible hospitalization or surgery if complications occur), benefits, and alternatives to EGD/colonoscopy with possible biopsy and possible polypectomy were discussed with the patient and he/she consents to proceed.   Joe Baria E. Tomasa Rand, MD [facility]   CC:  Willette Cluster, MD

## 2022-04-18 ENCOUNTER — Encounter: Payer: Self-pay | Admitting: Student

## 2022-04-18 ENCOUNTER — Other Ambulatory Visit: Payer: Self-pay | Admitting: Student

## 2022-04-18 ENCOUNTER — Ambulatory Visit (INDEPENDENT_AMBULATORY_CARE_PROVIDER_SITE_OTHER): Payer: Commercial Managed Care - HMO | Admitting: Student

## 2022-04-18 VITALS — BP 138/80 | HR 76 | Temp 98.2°F | Ht 66.0 in | Wt 232.8 lb

## 2022-04-18 DIAGNOSIS — E1165 Type 2 diabetes mellitus with hyperglycemia: Secondary | ICD-10-CM

## 2022-04-18 DIAGNOSIS — F419 Anxiety disorder, unspecified: Secondary | ICD-10-CM

## 2022-04-18 DIAGNOSIS — Z794 Long term (current) use of insulin: Secondary | ICD-10-CM

## 2022-04-18 DIAGNOSIS — Z7985 Long-term (current) use of injectable non-insulin antidiabetic drugs: Secondary | ICD-10-CM | POA: Diagnosis not present

## 2022-04-18 LAB — POCT GLYCOSYLATED HEMOGLOBIN (HGB A1C): Hemoglobin A1C: 6.9 % — AB (ref 4.0–5.6)

## 2022-04-18 LAB — GLUCOSE, CAPILLARY: Glucose-Capillary: 146 mg/dL — ABNORMAL HIGH (ref 70–99)

## 2022-04-18 MED ORDER — SEMAGLUTIDE(0.25 OR 0.5MG/DOS) 2 MG/3ML ~~LOC~~ SOPN: 0.5000 mg | PEN_INJECTOR | SUBCUTANEOUS | 3 refills | Status: DC

## 2022-04-18 NOTE — Patient Instructions (Addendum)
Iit was a pleasure seeing you in clinic today  We will restart ozempic. Start 0.25 mg weekly and increase to 0.5 mg weekly after 4 doses  Anxiety: please start Zoloft 50 mg daily.   Follow up 2 months

## 2022-04-20 ENCOUNTER — Encounter: Payer: Self-pay | Admitting: Student

## 2022-04-20 NOTE — Assessment & Plan Note (Signed)
Previously on ozempic but switched to trulicity as this was preferred by his insurance. Has has 4 doses of trulicity and feels abdominal bloating and diarrhea. Also less appetite control and reports eating more. Weight has been slowly increasing. He did not have these issues with ozempic and felt he had better weight loss with this. Will send in new script of ozempic as he was unable to tolerate truilicity. A1c improved to  6.1% today so overall GLP1 agonist seems to be improving his glycemic control.   -Stop truilicty  -Start ozempic 0.25 mg weekly increase to 0.5 mg weekly after 4 weeks.

## 2022-04-20 NOTE — Assessment & Plan Note (Addendum)
States lexapro made him feel strange. Still reports anxiety and irritability. Difficult sleeping and restless due to this. He did not fill ou GAD-7 today. PHQ9  7 he denies depressed mood or avolition. Will try zolfot 50 mg daily. Discussed it may take 2-4 weeks for response.  Follow up in 2 months

## 2022-04-20 NOTE — Progress Notes (Signed)
Established Patient Office Visit  Subjective   Patient ID: Joe Pearson, male    DOB: [birth year]  Age: 51 y.o. MRN: [id number]  No chief complaint on file.   Joe Pearson is a 51 y.o. person living with a history listed below who presents to clinic for follow up of diabetes. Not tolerating switch to trulicity with significantly more GI symptoms compared to ozempic. Please refer to problem based charting for further details and assessment and plan of current problem and chronic medical conditions.    Patient Active Problem List   Diagnosis Date Noted   Dysphagia 02/14/2022   Multiple osteochondromas of long bone 12/10/2021   Splinter of foot without infection, right, subsequent encounter 12/10/2021   Acute left eye pain 08/16/2021   Palpitations 07/30/2021   COVID-19 virus infection 07/02/2021   Anxiety 06/17/2021   Post herpetic neuralgia 04/16/2021   Seasonal allergies 07/31/2020   Hyperlipidemia 07/03/2020   Abnormal TSH 07/03/2020   Type 2 diabetes mellitus ([place]) 02/05/2020   Hypertension 02/05/2020   GERD (gastroesophageal reflux disease) 09/13/2018   Onychomycosis of great toe 03/29/2018   Blurry vision, bilateral 03/29/2018   Class 2 obesity due to excess calories with body mass index (BMI) of 36.0 to 36.9 in adult 02/16/2018   Stage 2 chronic kidney disease 52/77/8242   Metabolic Associated Fatty Liver Disease 01/11/2018   Healthcare maintenance 02/25/2015   Cervical radiculopathy 07/21/2011      ROS: negative as per HPI    Objective:     BP 138/80 (BP Location: Right Arm, Patient Position: Sitting, Cuff Size: Small)   Pulse 76   Temp 98.2 F (36.8 C) (Oral)   Ht _0  (1.676 m)   Wt 232 lb 12.8 oz (105.6 kg)   SpO2 97%   BMI 37.57 kg/m  BP Readings from Last 3 Encounters:  04/18/22 138/80  04/01/22 102/76  02/14/22 116/68      Physical Exam Constitutional:      Appearance: Normal appearance.  HENT:     Head: Normocephalic and  atraumatic.     Mouth/Throat:     Mouth: Mucous membranes are moist.     Pharynx: Oropharynx is clear.  Cardiovascular:     Rate and Rhythm: Normal rate and regular rhythm.  Pulmonary:     Effort: Pulmonary effort is normal.     Breath sounds: No rhonchi or rales.  Abdominal:     General: Abdomen is flat. Bowel sounds are normal. There is no distension.     Palpations: Abdomen is soft.     Tenderness: There is abdominal tenderness (mild diffuse).  Musculoskeletal:        General: Normal range of motion.     Right lower leg: No edema.     Left lower leg: No edema.  Skin:    General: Skin is warm and dry.     Capillary Refill: Capillary refill takes less than 2 seconds.  Neurological:     General: No focal deficit present.     Mental Status: He is alert and oriented to person, place, and time.  Psychiatric:        Mood and Affect: Mood normal.        Behavior: Behavior normal.      Results for orders placed or performed in visit on 35/36/14  Basic metabolic panel  Result Value Ref Range   Creatinine 1.3 0.6 - 1.3  Comprehensive metabolic panel  Result Value Ref Range   eGFR 66  Creat 1.3   Results for orders placed or performed in visit on 04/18/22  Glucose, capillary  Result Value Ref Range   Glucose-Capillary 146 (H) 70 - 99 mg/dL  POC Hbg A1C  Result Value Ref Range   Hemoglobin A1C 6.9 (A) 4.0 - 5.6 %   HbA1c POC (<> result, manual entry)     HbA1c, POC (prediabetic range)     HbA1c, POC (controlled diabetic range)      Last hemoglobin A1c Lab Results  Component Value Date   HGBA1C 6.9 (A) 04/18/2022      The ASCVD Risk score (Arnett DK, et al., 2019) failed to calculate for the following reasons:   The valid total cholesterol range is 130 to 320 mg/dL    Assessment & Plan:   Problem List Items Addressed This Visit       Endocrine   Type 2 diabetes mellitus ([place]) - Primary (Chronic)    Previously on ozempic but switched to trulicity as this was  preferred by his insurance. Has has 4 doses of trulicity and feels abdominal bloating and diarrhea. Also less appetite control and reports eating more. Weight has been slowly increasing. He did not have these issues with ozempic and felt he had better weight loss with this. Will send in new script of ozempic as he was unable to tolerate truilicity. A1c improved to  6.1% today so overall GLP1 agonist seems to be improving his glycemic control.   -Stop truilicty  -Start ozempic 0.25 mg weekly increase to 0.5 mg weekly after 4 weeks.       Relevant Medications   Semaglutide,0.25 or 0.5MG/DOS, 2 MG/3ML SOPN   Other Relevant Orders   POC Hbg A1C (Completed)     Other   Anxiety    States lexapro made him feel strange. Still reports anxiety and irritability. Difficult sleeping and restless due to this. He did not fill ou GAD-7 today. PHQ9  7 he denies depressed mood or avolition. Will try zolfot 50 mg daily. Discussed it may take 2-4 weeks for response.  Follow up in 2 months       Return in about 2 months (around 06/19/2022).    Iona Beard, MD

## 2022-04-21 NOTE — Progress Notes (Signed)
Internal Medicine Clinic Attending  Case discussed with Dr. Liang  at the time of the visit.  We reviewed the resident's history and exam and pertinent patient test results.  I agree with the assessment, diagnosis, and plan of care documented in the resident's note.  

## 2022-04-27 ENCOUNTER — Telehealth: Payer: Self-pay | Admitting: *Deleted

## 2022-04-27 ENCOUNTER — Other Ambulatory Visit: Payer: Self-pay | Admitting: Student

## 2022-04-27 MED ORDER — SEMAGLUTIDE(0.25 OR 0.5MG/DOS) 2 MG/3ML ~~LOC~~ SOPN: 0.2500 mg | PEN_INJECTOR | SUBCUTANEOUS | 2 refills | Status: DC

## 2022-04-27 NOTE — Telephone Encounter (Signed)
Call from patient states he would rather go back on the Trulicity instead of doing th Byetta.  Really wants something to cub his appetite like the Ozemepic.  He reported good weight loss with that.  Patient does not want to keep changing his insulins

## 2022-04-27 NOTE — Telephone Encounter (Signed)
Looks like Dr. Aundria Rud wrote for the byetta. Not entirely sure if this was due to insurance or some other reason. Will send in new script for ozempic to see if he is able to pick it up. Called and discussed with the patient.

## 2022-05-11 ENCOUNTER — Telehealth: Payer: Self-pay | Admitting: *Deleted

## 2022-05-11 NOTE — Telephone Encounter (Signed)
Pt states his insurance will not cover Ozempic (he received a letter). He's requesting something else ,besides Byetta, to help curb his appetite and help with weight loss. Also stated he has not had an injection in 3 weeks.

## 2022-05-12 NOTE — Telephone Encounter (Signed)
Looks like he has [employer], would probably try to get him mounjaro with coupon card, however the card expires on 05/15/22, meaning that he will only be able to get the first month, a better option may be to wait until Jan 1st and see which [email or web address] has a new coupon card

## 2022-05-18 ENCOUNTER — Encounter: Payer: Commercial Managed Care - HMO | Admitting: Gastroenterology

## 2022-05-18 NOTE — Telephone Encounter (Signed)
Pt called and informed of Butch Penny P's response. Pt stated he had talked to the pharmacy before and Trulicity was available. Pt wants Trulicity rx sent to his pharmacy. Thanks

## 2022-05-18 NOTE — Telephone Encounter (Signed)
Butch Penny do you have any suggestions or can you help? Thanks

## 2022-05-20 ENCOUNTER — Other Ambulatory Visit: Payer: Self-pay | Admitting: Student

## 2022-05-20 MED ORDER — TRULICITY 0.75 MG/0.5ML ~~LOC~~ SOAJ: 0.7500 mg | SUBCUTANEOUS | 2 refills | Status: AC

## 2022-06-08 ENCOUNTER — Telehealth: Payer: Self-pay

## 2022-06-08 NOTE — Telephone Encounter (Signed)
Pt is  requesting a call back .Joe Pearson He stated that his pharmacist  told him he needs a PA   for his trulicity .Joe Pearson He stated that he has not collected his meds that was sent in on 05/20/22 and has been out  of meds for a while now

## 2022-06-09 ENCOUNTER — Encounter: Payer: Self-pay | Admitting: Internal Medicine

## 2022-06-09 ENCOUNTER — Ambulatory Visit (INDEPENDENT_AMBULATORY_CARE_PROVIDER_SITE_OTHER): Payer: Commercial Managed Care - HMO | Admitting: Internal Medicine

## 2022-06-09 ENCOUNTER — Other Ambulatory Visit: Payer: Self-pay

## 2022-06-09 VITALS — BP 131/89 | HR 71 | Temp 98.3°F | Resp 28 | Ht 66.0 in | Wt 242.8 lb

## 2022-06-09 DIAGNOSIS — S025XXB Fracture of tooth (traumatic), initial encounter for open fracture: Secondary | ICD-10-CM | POA: Diagnosis not present

## 2022-06-09 MED ORDER — KETOROLAC TROMETHAMINE 10 MG PO TABS: 10.0000 mg | ORAL_TABLET | Freq: Four times a day (QID) | ORAL | 0 refills | Status: AC | PRN

## 2022-06-09 NOTE — Progress Notes (Signed)
   CC: mouth pain  HPI:Mr.YOSEF KROGH is a 52 y.o. male who presents for evaluation of mouth pain. Please see individual problem based A/P for details.  40 yom with hx HTN, DM, anxiety, HLD, obesity, CKD, GERD, metabolic fatty liver Dz,   Depression, PHQ-9: Based on the patients  [facility] Visit from 04/18/2022 in [facility]  PHQ-9 Total Score 7      score we have .  Past Medical History:  Diagnosis Date   COVID-19 virus vaccine not available 02/05/2020   Diabetes mellitus without complication (HCC)    Hyperlipidemia    Hypertension    NAFLD (nonalcoholic fatty liver disease)    Review of Systems:   See HPI  Physical Exam: Vitals:   06/09/22 1059  BP: 131/89  Pulse: 71  Resp: (!) 28  Temp: 98.3 F (36.8 C)  TempSrc: Oral  SpO2: 100%  Weight: 242 lb 12.8 oz (110.1 kg)  Height: 5\' 6"  (1.676 m)   General: nad HEENT: On exam, he has multiple caries and broken teeth. There is a small flesh colored palpable, firm, nodule just posterior to broken back right molar on gum line that is tender to palpation. No evidence of edema or erythema to suggest infection. No evidence of salivary gland involvement. Halitosis. Neck is supple, no induration or edema. Not tender to exam and no lymphadenopathy. Airway clear.  Cardiovascular: Normal rate, regular rhythm.  No murmurs, rubs, or gallops Pulmonary : Equal breath sounds, No wheezes, rales, or rhonchi Abdominal: soft, nontender,  bowel sounds present Ext: No edema in lower extremities, no tenderness to palpation of lower extremities.   Assessment & Plan:   See Encounters Tab for problem based charting.  Patient seen with Dr.  Cain Sieve

## 2022-06-09 NOTE — Patient Instructions (Signed)
Dear Joe Pearson,  Thank you for trusting [place] with your care today. We evaluated you for mouth pain. It appears you have a broken tooth with some inflammation related to this. We would like for you to see dentistry. We will give you Toradol to control the pain. There is no need to start antibiotics or get imaging at this time.  If you notice any difficulty clearing/swallowing your saliva, trouble breathing, swelling in your neck, or fevers/chills please go to the emergency department.  Please follow up in 3 months.

## 2022-06-10 ENCOUNTER — Other Ambulatory Visit (HOSPITAL_COMMUNITY): Payer: Self-pay

## 2022-06-10 NOTE — Telephone Encounter (Signed)
A Prior Authorization was attempted via CoverMyMeds but could not be sent to plan due to patients non payment of premium.  Left pt message with information.

## 2022-06-12 ENCOUNTER — Encounter: Payer: Self-pay | Admitting: Internal Medicine

## 2022-06-12 DIAGNOSIS — S025XXA Fracture of tooth (traumatic), initial encounter for closed fracture: Secondary | ICD-10-CM | POA: Insufficient documentation

## 2022-06-12 NOTE — Assessment & Plan Note (Signed)
Patient reports dental pain for 3 weeks. Pain is throbbing. Does report that when chewing food, he will have point tenderness that exacerbates it. Does have some tenderness when pressure applied externally over area of broken tooth. Pain is keeping him up at night. Has tried ibuprofen, tylenol, orgal. He does have a dentist and last saw prior dentist 3 years ago. Also complains of "mildew" taste in mouth. He says he called 4-5 places to be seen but that he has been told they are not accepting new patients, or that his insurance is not accepted. No trouble breathing, swallowing food/liquid. No fever or chills.   On exam, he has multiple caries and broken teeth. There is a small flesh colored palpable, firm, nodule just posterior to broken back right molar on gum line that is tender to palpation. No evidence of edema or erythema to suggest infection. No evidence of salivary gland involvement. Halitosis. Neck is supple, no induration or edema. Not tender to exam and no lymphadenopathy. Airway clear.   The small area of tenderness in pt's mouth appears to be local irritation/inflammation related to broken back molar without signs of infection/abscess. Physical exam is not consistent with that of sialadenitis. Patient does not have a history of heart attack or stroke, no hx GIB. Renal function and BP are appropriate for short course of oral Toradol for pain control. Patient will need further management by dentist for possible tooth extraction. Will refer to dentistry. Discuss strict precautions should he develop swelling of his neck, trouble swallowing/breathing, or if he should develop fever or chills.

## 2022-06-13 NOTE — Progress Notes (Signed)
Internal Medicine Clinic Attending  I saw and evaluated the patient.  I personally confirmed the key portions of the history and exam documented by Dr. Elliot Gurney and I reviewed pertinent patient test results.  The assessment, diagnosis, and plan were formulated together and I agree with the documentation in the resident's note.    Tooth does not look infected, I do not appreciate any discrete abscess, and he has no systemic signs of infection. We counseled him on warning signs and have placed a Dentist referral.

## 2022-07-04 ENCOUNTER — Telehealth: Payer: Self-pay

## 2022-07-14 NOTE — Telephone Encounter (Signed)
done 

## 2022-09-05 ENCOUNTER — Encounter: Payer: Self-pay | Admitting: Dietician
